# Patient Record
Sex: Female | Born: 1955 | Race: White | Hispanic: No | Marital: Married | Smoking: Former smoker
Health system: Southern US, Community
[De-identification: ages and names within clinical notes are randomized; demographics above are authoritative.]

## PROBLEM LIST (undated history)

## (undated) DIAGNOSIS — Z8739 Personal history of other diseases of the musculoskeletal system and connective tissue: Secondary | ICD-10-CM

## (undated) DIAGNOSIS — IMO0002 Reserved for concepts with insufficient information to code with codable children: Secondary | ICD-10-CM

## (undated) HISTORY — PX: BREAST BIOPSY: SHX20

## (undated) HISTORY — PX: FOOT SURGERY: SHX648

---

## 2001-07-20 ENCOUNTER — Other Ambulatory Visit: Admission: RE | Admit: 2001-07-20 | Discharge: 2001-07-20 | Payer: Self-pay | Admitting: Obstetrics & Gynecology

## 2004-08-18 ENCOUNTER — Other Ambulatory Visit: Admission: RE | Admit: 2004-08-18 | Discharge: 2004-08-18 | Payer: Self-pay | Admitting: Obstetrics and Gynecology

## 2005-10-05 ENCOUNTER — Other Ambulatory Visit: Admission: RE | Admit: 2005-10-05 | Discharge: 2005-10-05 | Payer: Self-pay | Admitting: Obstetrics and Gynecology

## 2006-10-16 ENCOUNTER — Other Ambulatory Visit: Admission: RE | Admit: 2006-10-16 | Discharge: 2006-10-16 | Payer: Self-pay | Admitting: Obstetrics and Gynecology

## 2009-08-07 ENCOUNTER — Ambulatory Visit (HOSPITAL_BASED_OUTPATIENT_CLINIC_OR_DEPARTMENT_OTHER): Admission: RE | Admit: 2009-08-07 | Discharge: 2009-08-07 | Payer: Self-pay | Admitting: Obstetrics and Gynecology

## 2009-08-07 ENCOUNTER — Ambulatory Visit: Payer: Self-pay | Admitting: Diagnostic Radiology

## 2010-09-06 ENCOUNTER — Other Ambulatory Visit (HOSPITAL_BASED_OUTPATIENT_CLINIC_OR_DEPARTMENT_OTHER): Payer: Self-pay | Admitting: Obstetrics and Gynecology

## 2010-09-06 DIAGNOSIS — Z1231 Encounter for screening mammogram for malignant neoplasm of breast: Secondary | ICD-10-CM

## 2010-09-07 ENCOUNTER — Ambulatory Visit (HOSPITAL_BASED_OUTPATIENT_CLINIC_OR_DEPARTMENT_OTHER)
Admission: RE | Admit: 2010-09-07 | Discharge: 2010-09-07 | Disposition: A | Discharge status: Home / Self Care | Payer: PRIVATE HEALTH INSURANCE | Source: Ambulatory Visit | Attending: Obstetrics and Gynecology | Admitting: Obstetrics and Gynecology

## 2010-09-07 DIAGNOSIS — Z1231 Encounter for screening mammogram for malignant neoplasm of breast: Secondary | ICD-10-CM | POA: Insufficient documentation

## 2011-09-16 ENCOUNTER — Emergency Department
Admission: EM | Admit: 2011-09-16 | Discharge: 2011-09-16 | Disposition: A | Discharge status: Home / Self Care | Payer: PRIVATE HEALTH INSURANCE | Source: Home / Self Care | Attending: Emergency Medicine | Admitting: Emergency Medicine

## 2011-09-16 DIAGNOSIS — R300 Dysuria: Secondary | ICD-10-CM

## 2011-09-16 HISTORY — DX: Reserved for concepts with insufficient information to code with codable children: IMO0002

## 2011-09-16 LAB — POCT URINALYSIS DIP (MANUAL ENTRY)
Ketones, POC UA: NEGATIVE
Nitrite, UA: POSITIVE
Protein Ur, POC: NEGATIVE
Spec Grav, UA: 1.015 (ref 1.005–1.03)

## 2011-09-16 MED ORDER — CIPROFLOXACIN HCL 500 MG PO TABS: 500.0000 mg | ORAL_TABLET | Freq: Two times a day (BID) | ORAL | Status: AC

## 2011-09-16 NOTE — ED Notes (Signed)
Patient complains of painful urination x 1 day. Denies back pain.

## 2011-09-16 NOTE — ED Provider Notes (Signed)
History     CSN: [id number]  Arrival date & time 09/16/11  1821   First MD Initiated Contact with Patient 09/16/11 1825      Chief Complaint  Patient presents with  . Dysuria    x 1 day    (Consider location/radiation/quality/duration/timing/severity/associated sxs/prior treatment) HPI Connie Campos is a 56 y.o. female who presents today with UTI symptoms for 1 day.  She describes it as a "discomfort" during urination.  She has not been using any medications yet.  + dysuria No frequency No urgency No hematuria No vaginal discharge No fever/chills No lower abdominal pain No back pain No fatigue    Past Medical History  Diagnosis Date  . Herniated disc     Past Surgical History  Procedure Date  . Foot surgery     Family History  Problem Relation Age of Onset  . Diabetes Mother   . Cancer Father     lung  . Diabetes Sister   . Cancer Sister 75    Breast  . Diabetes Brother     History  Substance Use Topics  . Smoking status: Former Smoker -- 0.1 packs/day for 10 years  . Smokeless tobacco: Never Used  . Alcohol Use: 2.4 oz/week    4 Glasses of wine per week    OB History    Grav Para Term Preterm Abortions TAB SAB Ect Mult Living                  Review of Systems  All other systems reviewed and are negative.    Allergies  Monistat  Home Medications   Current Outpatient Rx  Name Route Sig Dispense Refill  . CYCLOBENZAPRINE HCL 10 MG PO TABS Oral Take 10 mg by mouth 3 (three) times daily as needed.    Marland Kitchen DICLOFENAC SODIUM 75 MG PO TBEC Oral Take 75 mg by mouth 2 (two) times daily as needed.    Marland Kitchen CIPROFLOXACIN HCL 500 MG PO TABS Oral Take 1 tablet (500 mg total) by mouth 2 (two) times daily. 10 tablet 0    BP 145/88  Pulse 76  Temp(Src) 97.7 F (36.5 C) (Oral)  Resp 16  Ht 5\' 6"  (1.676 m)  Wt 171 lb (77.565 kg)  BMI 27.60 kg/m2  SpO2 96%  Physical Exam  Nursing note and vitals reviewed. Constitutional: She is oriented to person, place,  and time. She appears well-developed and well-nourished.  HENT:  Head: Normocephalic and atraumatic.  Eyes: No scleral icterus.  Neck: Neck supple.  Cardiovascular: Regular rhythm and normal heart sounds.   Pulmonary/Chest: Effort normal and breath sounds normal. No respiratory distress.  Abdominal: Soft. Normal appearance and bowel sounds are normal. She exhibits no mass. There is no rebound, no guarding and no CVA tenderness.  Neurological: She is alert and oriented to person, place, and time.  Skin: Skin is warm and dry.  Psychiatric: She has a normal mood and affect. Her speech is normal.    ED Course  Procedures (including critical care time)  Labs Reviewed - No data to display No results found.   1. Dysuria       MDM  1) Take the prescribed antibiotic as directed.  Can take OTC Azo. 2) A urinalysis was done in clinic.  A urine culture is pending. 3) Follow up with your PCP or urologist if not improving or if worsening symptoms.     Marlaine Hind, MD 09/16/11 [phone]

## 2011-09-17 ENCOUNTER — Telehealth: Payer: Self-pay | Admitting: Family Medicine

## 2011-09-19 ENCOUNTER — Other Ambulatory Visit (HOSPITAL_BASED_OUTPATIENT_CLINIC_OR_DEPARTMENT_OTHER): Payer: Self-pay | Admitting: Obstetrics and Gynecology

## 2011-09-19 ENCOUNTER — Telehealth: Payer: Self-pay | Admitting: Family Medicine

## 2011-09-19 DIAGNOSIS — Z1231 Encounter for screening mammogram for malignant neoplasm of breast: Secondary | ICD-10-CM

## 2011-09-19 LAB — URINE CULTURE: Colony Count: >=100000

## 2011-09-29 ENCOUNTER — Ambulatory Visit (HOSPITAL_BASED_OUTPATIENT_CLINIC_OR_DEPARTMENT_OTHER)
Admission: RE | Admit: 2011-09-29 | Discharge: 2011-09-29 | Disposition: A | Discharge status: Home / Self Care | Payer: PRIVATE HEALTH INSURANCE | Source: Ambulatory Visit | Attending: Obstetrics and Gynecology | Admitting: Obstetrics and Gynecology

## 2011-09-29 ENCOUNTER — Ambulatory Visit (HOSPITAL_BASED_OUTPATIENT_CLINIC_OR_DEPARTMENT_OTHER): Payer: PRIVATE HEALTH INSURANCE

## 2011-09-29 DIAGNOSIS — Z1231 Encounter for screening mammogram for malignant neoplasm of breast: Secondary | ICD-10-CM | POA: Insufficient documentation

## 2012-09-10 ENCOUNTER — Other Ambulatory Visit (HOSPITAL_BASED_OUTPATIENT_CLINIC_OR_DEPARTMENT_OTHER): Payer: Self-pay | Admitting: Obstetrics and Gynecology

## 2012-09-10 DIAGNOSIS — Z1231 Encounter for screening mammogram for malignant neoplasm of breast: Secondary | ICD-10-CM

## 2012-09-11 ENCOUNTER — Ambulatory Visit (HOSPITAL_BASED_OUTPATIENT_CLINIC_OR_DEPARTMENT_OTHER)
Admission: RE | Admit: 2012-09-11 | Discharge: 2012-09-11 | Disposition: A | Discharge status: Home / Self Care | Payer: Managed Care, Other (non HMO) | Source: Ambulatory Visit | Attending: Obstetrics and Gynecology | Admitting: Obstetrics and Gynecology

## 2012-09-11 DIAGNOSIS — Z1231 Encounter for screening mammogram for malignant neoplasm of breast: Secondary | ICD-10-CM | POA: Insufficient documentation

## 2012-11-01 ENCOUNTER — Encounter: Payer: Self-pay | Admitting: Emergency Medicine

## 2012-11-01 ENCOUNTER — Emergency Department
Admission: EM | Admit: 2012-11-01 | Discharge: 2012-11-01 | Disposition: A | Discharge status: Home / Self Care | Payer: Managed Care, Other (non HMO) | Source: Home / Self Care | Attending: Family Medicine | Admitting: Family Medicine

## 2012-11-01 DIAGNOSIS — B373 Candidiasis of vulva and vagina: Secondary | ICD-10-CM

## 2012-11-01 DIAGNOSIS — R300 Dysuria: Secondary | ICD-10-CM

## 2012-11-01 LAB — POCT URINALYSIS DIP (MANUAL ENTRY)
Glucose, UA: NEGATIVE
Spec Grav, UA: 1.02 (ref 1.005–1.03)
Urobilinogen, UA: 0.2 (ref 0–1)

## 2012-11-01 MED ORDER — FLUCONAZOLE 150 MG PO TABS: 150.0000 mg | ORAL_TABLET | Freq: Once | ORAL | Status: DC

## 2012-11-01 MED ORDER — CEPHALEXIN 500 MG PO CAPS: 500.0000 mg | ORAL_CAPSULE | Freq: Three times a day (TID) | ORAL | Status: DC

## 2012-11-01 NOTE — ED Provider Notes (Signed)
History     CSN: [id number]  Arrival date & time 11/01/12  1745   First MD Initiated Contact with Patient 11/01/12 1747      Chief Complaint  Patient presents with  . Urinary Frequency   HPI  DYSURIA Onset:  2-3 days  Description: mild dysuria, increased urinary frequency  Modifying factors: none   Symptoms Urgency:  yes Frequency: yes  Hesitancy:  yes Hematuria:  no Flank Pain:  no Fever: no Nausea/Vomiting:  no Missed LMP: no STD exposure: no Discharge: yes; concerning for yeast infection  Irritants: no Rash: no  Red Flags   More than 3 UTI's last 12 months:  no PMH of  Diabetes or Immunosuppression:  no Renal Disease/Calculi: no Urinary Tract Abnormality:  no Instrumentation or Trauma: no    Past Medical History  Diagnosis Date  . Herniated disc     Past Surgical History  Procedure Laterality Date  . Foot surgery      Family History  Problem Relation Age of Onset  . Diabetes Mother   . Cancer Father     lung  . Diabetes Sister   . Cancer Sister 102    Breast  . Diabetes Brother     History  Substance Use Topics  . Smoking status: Former Smoker -- 0.10 packs/day for 10 years  . Smokeless tobacco: Never Used  . Alcohol Use: 2.4 oz/week    4 Glasses of wine per week    OB History   Grav Para Term Preterm Abortions TAB SAB Ect Mult Living                  Review of Systems  All other systems reviewed and are negative.    Allergies  Miconazole nitrate  Home Medications   Current Outpatient Rx  Name  Route  Sig  Dispense  Refill  . cyclobenzaprine (FLEXERIL) 10 MG tablet   Oral   Take 10 mg by mouth 3 (three) times daily as needed.         . diclofenac (VOLTAREN) 75 MG EC tablet   Oral   Take 75 mg by mouth 2 (two) times daily as needed.           BP 125/84  Pulse 62  Temp(Src) 97.2 F (36.2 C) (Oral)  Resp 16  Ht 5' 6.5" (1.689 m)  Wt 166 lb (75.297 kg)  BMI 26.39 kg/m2  SpO2 97%  Physical Exam   Constitutional: She appears well-developed and well-nourished.  HENT:  Head: Normocephalic and atraumatic.  Eyes: Conjunctivae are normal. Pupils are equal, round, and reactive to light.  Neck: Normal range of motion. Neck supple.  Cardiovascular: Normal rate and regular rhythm.   Pulmonary/Chest: Effort normal and breath sounds normal.  Abdominal: Soft. Bowel sounds are normal.  No flank pain  Minimal suprapubic tenderness   Musculoskeletal: Normal range of motion.  Neurological: She is alert.  Skin: Skin is warm.    ED Course  Procedures (including critical care time)  Labs Reviewed  URINE CULTURE  POCT URINALYSIS DIP (MANUAL ENTRY)   No results found.   1. Dysuria   2. Vaginal candidiasis       MDM  Will treat with keflex Urine culture Diflucan Discussed general, infectious, and GU red flags Follow up as needed.     The patient and/or caregiver has been counseled thoroughly with regard to treatment plan and/or medications prescribed including dosage, schedule, interactions, rationale for use, and possible side effects and  they verbalize understanding. Diagnoses and expected course of recovery discussed and will return if not improved as expected or if the condition worsens. Patient and/or caregiver verbalized understanding.             Doree Albee, MD 11/01/12 1827

## 2012-11-01 NOTE — ED Notes (Signed)
Patient gives 2 day history of frequency of urination with small bloody discharge. No OTCs

## 2012-11-03 LAB — URINE CULTURE: Colony Count: >=100000

## 2012-11-04 ENCOUNTER — Telehealth: Payer: Self-pay

## 2012-11-04 MED ORDER — CIPROFLOXACIN HCL 500 MG PO TABS: 500.0000 mg | ORAL_TABLET | Freq: Two times a day (BID) | ORAL | Status: AC

## 2012-11-04 NOTE — ED Notes (Addendum)
I called and spoke with patient and she is doing better. I advised to call back if anything changes or if she has questions or concerns. Advised her to finish antibiotic, Per Dr Alvester Morin she needs different antibiotic.

## 2013-02-11 ENCOUNTER — Encounter: Payer: Self-pay | Admitting: *Deleted

## 2013-02-11 ENCOUNTER — Emergency Department
Admission: EM | Admit: 2013-02-11 | Discharge: 2013-02-11 | Disposition: A | Discharge status: Home / Self Care | Payer: Managed Care, Other (non HMO) | Source: Home / Self Care | Attending: Family Medicine | Admitting: Family Medicine

## 2013-02-11 DIAGNOSIS — J029 Acute pharyngitis, unspecified: Secondary | ICD-10-CM

## 2013-02-11 LAB — POCT RAPID STREP A (OFFICE): Rapid Strep A Screen: NEGATIVE

## 2013-02-11 MED ORDER — FLUCONAZOLE 150 MG PO TABS: 150.0000 mg | ORAL_TABLET | Freq: Once | ORAL | Status: DC

## 2013-02-11 MED ORDER — DICLOFENAC SODIUM 75 MG PO TBEC: 75.0000 mg | DELAYED_RELEASE_TABLET | Freq: Two times a day (BID) | ORAL | Status: DC | PRN

## 2013-02-11 MED ORDER — PENICILLIN V POTASSIUM 500 MG PO TABS: ORAL_TABLET | ORAL | Status: DC

## 2013-02-11 NOTE — ED Notes (Signed)
Pt c/o HA, sore throat, and bilateral ear ache x 3 days. Denies fever.

## 2013-02-11 NOTE — ED Provider Notes (Signed)
CSN: [id number]     Arrival date & time 02/11/13  1145 History   First MD Initiated Contact with Patient 02/11/13 1205     Chief Complaint  Patient presents with  . Sore Throat  . Otalgia  . Headache      HPI Comments: Patient complains of onset of sore throat and mild ear congestion about 3 days ago.  No other URI symptoms.  The history is provided by the patient.    Past Medical History  Diagnosis Date  . Herniated disc    Past Surgical History  Procedure Laterality Date  . Foot surgery     Family History  Problem Relation Age of Onset  . Diabetes Mother   . Cancer Father     lung  . Diabetes Sister   . Cancer Sister 44    Breast  . Diabetes Brother    History  Substance Use Topics  . Smoking status: Former Smoker -- 0.10 packs/day for 10 years  . Smokeless tobacco: Never Used  . Alcohol Use: 2.4 oz/week    4 Glasses of wine per week   OB History   Grav Para Term Preterm Abortions TAB SAB Ect Mult Living                 Review of Systems + sore throat No cough No pleuritic pain No wheezing No nasal congestion No post-nasal drainage No sinus pain/pressure No itchy/red eyes ? earache No hemoptysis No SOB No fever/chills No nausea No vomiting No abdominal pain No diarrhea No urinary symptoms No skin rashes No fatigue No myalgias No headache    Allergies  Miconazole nitrate  Home Medications   Current Outpatient Rx  Name  Route  Sig  Dispense  Refill  . phentermine 37.5 MG capsule   Oral   Take 37.5 mg by mouth every morning.         . cyclobenzaprine (FLEXERIL) 10 MG tablet   Oral   Take 10 mg by mouth 3 (three) times daily as needed.         . diclofenac (VOLTAREN) 75 MG EC tablet   Oral   Take 75 mg by mouth 2 (two) times daily as needed.         . fluconazole (DIFLUCAN) 150 MG tablet   Oral   Take 1 tablet (150 mg total) by mouth once. Repeat if needed   2 tablet   0   . penicillin v potassium (VEETID) 500 MG tablet     Take one tab by mouth twice daily for 10 days   20 tablet   0    BP 111/76  Pulse 81  Temp(Src) 98.3 F (36.8 C) (Oral)  Resp 16  Wt 158 lb (71.668 kg)  BMI 25.12 kg/m2  SpO2 97% Physical Exam Nursing notes and Vital Signs reviewed. Appearance:  Patient appears healthy, stated age, and in no acute distress Eyes:  Pupils are equal, round, and reactive to light and accomodation.  Extraocular movement is intact.  Conjunctivae are not inflamed  Ears:  Canals normal.  Tympanic membranes normal.  Nose:   Normal turbinates.  No sinus tenderness.    Pharynx:  Erythematous without exudate. Neck:  Supple.  Tender shotty anterior nodes. Lungs:  Clear to auscultation.  Breath sounds are equal.  Heart:  Regular rate and rhythm without murmurs, rubs, or gallops.  Abdomen:  Nontender without masses or hepatosplenomegaly.  Bowel sounds are present.  No CVA or flank tenderness.  Extremities:  No edema.  No calf tenderness Skin:  No rash present.   ED Course  Procedures  none    Labs Reviewed  STREP A DNA PROBE pending  POCT RAPID STREP A (OFFICE) negative       MDM   1. Acute pharyngitis; ?false negative rapid strep test    Throat culture pending. Begin Pen VK.  At patient's request, Rx written for Diflucan (2 tabs) (reports no adverse effects from Diflucan. May continue Voltaren (Rx written).  Try warm salt water gargles. Followup with Family Doctor if not improved in one week.     Lattie Haw, MD 02/11/13 1258

## 2013-02-13 ENCOUNTER — Telehealth: Payer: Self-pay | Admitting: *Deleted

## 2013-04-22 ENCOUNTER — Other Ambulatory Visit: Payer: Self-pay | Admitting: Family Medicine

## 2013-04-22 DIAGNOSIS — Z780 Asymptomatic menopausal state: Secondary | ICD-10-CM

## 2013-04-23 ENCOUNTER — Other Ambulatory Visit: Payer: Self-pay | Admitting: Family Medicine

## 2013-04-23 DIAGNOSIS — Z780 Asymptomatic menopausal state: Secondary | ICD-10-CM

## 2013-04-23 DIAGNOSIS — Z0000 Encounter for general adult medical examination without abnormal findings: Secondary | ICD-10-CM

## 2013-05-07 ENCOUNTER — Ambulatory Visit (INDEPENDENT_AMBULATORY_CARE_PROVIDER_SITE_OTHER): Payer: Managed Care, Other (non HMO)

## 2013-05-07 ENCOUNTER — Other Ambulatory Visit: Payer: Managed Care, Other (non HMO)

## 2013-05-07 DIAGNOSIS — M899 Disorder of bone, unspecified: Secondary | ICD-10-CM

## 2013-05-07 DIAGNOSIS — Z0000 Encounter for general adult medical examination without abnormal findings: Secondary | ICD-10-CM

## 2013-09-19 ENCOUNTER — Other Ambulatory Visit (HOSPITAL_BASED_OUTPATIENT_CLINIC_OR_DEPARTMENT_OTHER): Payer: Self-pay | Admitting: Obstetrics and Gynecology

## 2013-09-19 DIAGNOSIS — Z1231 Encounter for screening mammogram for malignant neoplasm of breast: Secondary | ICD-10-CM

## 2013-09-20 ENCOUNTER — Ambulatory Visit (HOSPITAL_BASED_OUTPATIENT_CLINIC_OR_DEPARTMENT_OTHER)
Admission: RE | Admit: 2013-09-20 | Discharge: 2013-09-20 | Disposition: A | Discharge status: Home / Self Care | Payer: Managed Care, Other (non HMO) | Source: Ambulatory Visit | Attending: Obstetrics and Gynecology | Admitting: Obstetrics and Gynecology

## 2013-09-20 DIAGNOSIS — Z1231 Encounter for screening mammogram for malignant neoplasm of breast: Secondary | ICD-10-CM

## 2014-07-19 ENCOUNTER — Emergency Department
Admission: EM | Admit: 2014-07-19 | Discharge: 2014-07-19 | Disposition: A | Discharge status: Home / Self Care | Payer: Managed Care, Other (non HMO) | Source: Home / Self Care | Attending: Family Medicine | Admitting: Family Medicine

## 2014-07-19 DIAGNOSIS — T700XXA Otitic barotrauma, initial encounter: Secondary | ICD-10-CM

## 2014-07-19 DIAGNOSIS — J029 Acute pharyngitis, unspecified: Secondary | ICD-10-CM

## 2014-07-19 LAB — POCT RAPID STREP A (OFFICE): RAPID STREP A SCREEN: NEGATIVE

## 2014-07-19 MED ORDER — PREDNISONE 10 MG PO TABS: 30.0000 mg | ORAL_TABLET | Freq: Every day | ORAL | Status: DC

## 2014-07-19 MED ORDER — IPRATROPIUM BROMIDE 0.06 % NA SOLN: 2.0000 | Freq: Four times a day (QID) | NASAL | Status: DC

## 2014-07-19 NOTE — ED Provider Notes (Signed)
Connie FlockGeraldine Campos is a 59 y.o. female who presents to Urgent Care today for headaches sore throat and facial pain and ear congestion starting yesterday. Patient's been exposed to sick people at work. No fevers or chills vomiting or diarrhea. Patient has not tried any medications yet. No trouble breathing. No vomiting or diarrhea.   Past Medical History  Diagnosis Date  . Herniated disc    Past Surgical History  Procedure Laterality Date  . Foot surgery     History  Substance Use Topics  . Smoking status: Former Smoker -- 0.10 packs/day for 10 years  . Smokeless tobacco: Never Used  . Alcohol Use: 2.4 oz/week    4 Glasses of wine per week   ROS as above Medications: No current facility-administered medications for this encounter.   Current Outpatient Prescriptions  Medication Sig Dispense Refill  . ipratropium (ATROVENT) 0.06 % nasal spray Place 2 sprays into both nostrils 4 (four) times daily. 15 mL 1  . predniSONE (DELTASONE) 10 MG tablet Take 3 tablets (30 mg total) by mouth daily. 15 tablet 0   Allergies  Allergen Reactions  . Miconazole Nitrate      Exam:  BP 122/86 mmHg  Pulse 80  Temp(Src) 97.9 F (36.6 C) (Oral)  Wt 174 lb (78.926 kg)  SpO2 95% Gen: Well NAD HEENT: EOMI,  MMM tympanic membranes are retracted bilaterally without erythema. Mastoids are nontender. Posterior pharynx with cobblestoning. Clear nasal discharge. Nontender maxillary and frontal sinuses. Lungs: Normal work of breathing. CTABL Heart: RRR no MRG Abd: NABS, Soft. Nondistended, Nontender Exts: Brisk capillary refill, warm and well perfused.   Results for orders placed or performed during the hospital encounter of 07/19/14 (from the past 24 hour(s))  POCT rapid strep A     Status: None   Collection Time: 07/19/14 12:11 PM  Result Value Ref Range   Rapid Strep A Screen Negative Negative   No results found.  Assessment and Plan: 59 y.o. female w viral pharyngitis. Treatment with Atrovent  nasal spray and prednisone. Culture pending. Follow-up as needed.  Discussed warning signs or symptoms. Please see discharge instructions. Patient expresses understanding.     Rodolph BongEvan S Corey, MD 07/19/14 [phone]

## 2014-07-19 NOTE — ED Notes (Signed)
Patient c/o sore throat, ears feel "clogged" sx started last night

## 2014-07-19 NOTE — Discharge Instructions (Signed)
Thank you for coming in today. °Call or go to the emergency room if you get worse, have trouble breathing, have chest pains, or palpitations.  ° °Pharyngitis °Pharyngitis is redness, pain, and swelling (inflammation) of your pharynx.  °CAUSES  °Pharyngitis is usually caused by infection. Most of the time, these infections are from viruses (viral) and are part of a cold. However, sometimes pharyngitis is caused by bacteria (bacterial). Pharyngitis can also be caused by allergies. Viral pharyngitis may be spread from person to person by coughing, sneezing, and personal items or utensils (cups, forks, spoons, toothbrushes). Bacterial pharyngitis may be spread from person to person by more intimate contact, such as kissing.  °SIGNS AND SYMPTOMS  °Symptoms of pharyngitis include:   °· Sore throat.   °· Tiredness (fatigue).   °· Low-grade fever.   °· Headache. °· Joint pain and muscle aches. °· Skin rashes. °· Swollen lymph nodes. °· Plaque-like film on throat or tonsils (often seen with bacterial pharyngitis). °DIAGNOSIS  °Your health care provider will ask you questions about your illness and your symptoms. Your medical history, along with a physical exam, is often all that is needed to diagnose pharyngitis. Sometimes, a rapid strep test is done. Other lab tests may also be done, depending on the suspected cause.  °TREATMENT  °Viral pharyngitis will usually get better in 3-4 days without the use of medicine. Bacterial pharyngitis is treated with medicines that kill germs (antibiotics).  °HOME CARE INSTRUCTIONS  °· Drink enough water and fluids to keep your urine clear or pale yellow.   °· Only take over-the-counter or prescription medicines as directed by your health care provider:   °· If you are prescribed antibiotics, make sure you finish them even if you start to feel better.   °· Do not take aspirin.   °· Get lots of rest.   °· Gargle with 8 oz of salt water (½ tsp of salt per 1 qt of water) as often as every 1-2  hours to soothe your throat.   °· Throat lozenges (if you are not at risk for choking) or sprays may be used to soothe your throat. °SEEK MEDICAL CARE IF:  °· You have large, tender lumps in your neck. °· You have a rash. °· You cough up green, yellow-brown, or bloody spit. °SEEK IMMEDIATE MEDICAL CARE IF:  °· Your neck becomes stiff. °· You drool or are unable to swallow liquids. °· You vomit or are unable to keep medicines or liquids down. °· You have severe pain that does not go away with the use of recommended medicines. °· You have trouble breathing (not caused by a stuffy nose). °MAKE SURE YOU:  °· Understand these instructions. °· Will watch your condition. °· Will get help right away if you are not doing well or get worse. °Document Released: 05/30/2005 Document Revised: 03/20/2013 Document Reviewed: 02/04/2013 °[employer]® Patient Information ©2015 [employer]. This information is not intended to replace advice given to you by your health care provider. Make sure you discuss any questions you have with your health care provider. ° ° °Barotitis Media °Barotitis media is inflammation of your middle ear. This occurs when the auditory tube (eustachian tube) leading from the back of your nose (nasopharynx) to your eardrum is blocked. This blockage may result from a cold, environmental allergies, or an upper respiratory infection. Unresolved barotitis media may lead to damage or hearing loss (barotrauma), which may become permanent. °HOME CARE INSTRUCTIONS  °· Use medicines as recommended by your health care provider. Over-the-counter medicines will help unblock the canal   and can help during times of air travel. °· Do not put anything into your ears to clean or unplug them. Eardrops will not be helpful. °· Do not swim, dive, or fly until your health care provider says it is all right to do so. If these activities are necessary, chewing gum with frequent, forceful swallowing may help. It is also helpful to hold  your nose and gently blow to pop your ears for equalizing pressure changes. This forces air into the eustachian tube. °· Only take over-the-counter or prescription medicines for pain, discomfort, or fever as directed by your health care provider. °· A decongestant may be helpful in decongesting the middle ear and make pressure equalization easier. °SEEK MEDICAL CARE IF: °· You experience a serious form of dizziness in which you feel as if the room is spinning and you feel nauseated (vertigo). °· Your symptoms only involve one ear. °SEEK IMMEDIATE MEDICAL CARE IF:  °· You develop a severe headache, dizziness, or severe ear pain. °· You have bloody or pus-like drainage from your ears. °· You develop a fever. °· Your problems do not improve or become worse. °MAKE SURE YOU:  °· Understand these instructions. °· Will watch your condition. °· Will get help right away if you are not doing well or get worse. °Document Released: 05/27/2000 Document Revised: 03/20/2013 Document Reviewed: 12/25/2012 °[employer]® Patient Information ©2015 [employer]. This information is not intended to replace advice given to you by your health care provider. Make sure you discuss any questions you have with your health care provider. ° °

## 2014-07-21 ENCOUNTER — Telehealth: Payer: Self-pay | Admitting: *Deleted

## 2014-08-28 ENCOUNTER — Other Ambulatory Visit (HOSPITAL_BASED_OUTPATIENT_CLINIC_OR_DEPARTMENT_OTHER): Payer: Self-pay | Admitting: Nurse Practitioner

## 2014-08-28 DIAGNOSIS — Z1231 Encounter for screening mammogram for malignant neoplasm of breast: Secondary | ICD-10-CM

## 2014-09-01 ENCOUNTER — Ambulatory Visit (HOSPITAL_BASED_OUTPATIENT_CLINIC_OR_DEPARTMENT_OTHER): Payer: Managed Care, Other (non HMO)

## 2014-09-02 ENCOUNTER — Ambulatory Visit (HOSPITAL_BASED_OUTPATIENT_CLINIC_OR_DEPARTMENT_OTHER)
Admission: RE | Admit: 2014-09-02 | Discharge: 2014-09-02 | Disposition: A | Discharge status: Home / Self Care | Payer: Managed Care, Other (non HMO) | Source: Ambulatory Visit | Attending: Nurse Practitioner | Admitting: Nurse Practitioner

## 2014-09-02 DIAGNOSIS — Z1231 Encounter for screening mammogram for malignant neoplasm of breast: Secondary | ICD-10-CM | POA: Diagnosis present

## 2015-04-12 ENCOUNTER — Emergency Department
Admission: EM | Admit: 2015-04-12 | Discharge: 2015-04-12 | Disposition: A | Discharge status: Home / Self Care | Payer: Managed Care, Other (non HMO) | Source: Home / Self Care | Attending: Family Medicine | Admitting: Family Medicine

## 2015-04-12 ENCOUNTER — Encounter: Payer: Self-pay | Admitting: Emergency Medicine

## 2015-04-12 DIAGNOSIS — J069 Acute upper respiratory infection, unspecified: Secondary | ICD-10-CM | POA: Diagnosis not present

## 2015-04-12 DIAGNOSIS — B9789 Other viral agents as the cause of diseases classified elsewhere: Principal | ICD-10-CM

## 2015-04-12 MED ORDER — AMOXICILLIN 875 MG PO TABS: 875.0000 mg | ORAL_TABLET | Freq: Two times a day (BID) | ORAL | Status: DC

## 2015-04-12 NOTE — ED Notes (Signed)
Reports one week of congestion, initial sore throat, cough, and ear pain. Took OTC this morning.

## 2015-04-12 NOTE — ED Provider Notes (Signed)
CSN: [id number]     Arrival date & time 04/12/15  1144 History   First MD Initiated Contact with Patient 04/12/15 1244     Chief Complaint  Patient presents with  . Nasal Congestion  . Ear Pain  . Cough      HPI Comments: Patient complains of six day history of typical cold-like symptoms including mild sore throat (now resolved), sinus congestion, fatigue, ear pressure, and cough.   The history is provided by the patient.    Past Medical History  Diagnosis Date  . Herniated disc    Past Surgical History  Procedure Laterality Date  . Foot surgery     Family History  Problem Relation Age of Onset  . Diabetes Mother   . Cancer Father     lung  . Diabetes Sister   . Cancer Sister 760    Breast  . Diabetes Brother    Social History  Substance Use Topics  . Smoking status: Former Smoker -- 0.10 packs/day for 10 years  . Smokeless tobacco: Never Used  . Alcohol Use: 2.4 oz/week    4 Glasses of wine per week   OB History    No data available     Review of Systems + sore throat + cough + sneezing No pleuritic pain No wheezing + nasal congestion + post-nasal drainage No sinus pain/pressure No itchy/red eyes ? earache No hemoptysis No SOB ? fever, no chills No nausea No vomiting No abdominal pain No diarrhea No urinary symptoms No skin rash + fatigue No myalgias No headache Used OTC meds without relief  Allergies  Miconazole nitrate  Home Medications   Prior to Admission medications   Medication Sig Start Date End Date Taking? Authorizing Provider  diclofenac sodium (VOLTAREN) 1 % GEL Apply topically 4 (four) times daily.   Yes Historical Provider, MD  amoxicillin (AMOXIL) 875 MG tablet Take 1 tablet (875 mg total) by mouth 2 (two) times daily. (Rx void after 04/19/15) 04/12/15   Lattie HawStephen A Asharia Lotter, MD  ipratropium (ATROVENT) 0.06 % nasal spray Place 2 sprays into both nostrils 4 (four) times daily. 07/19/14   Rodolph BongEvan S Corey, MD   Meds Ordered and  Administered this Visit  Medications - No data to display  BP 116/78 mmHg  Pulse 77  Temp(Src) 97.9 F (36.6 C) (Oral)  Resp 16  Ht 5\' 6"  (1.676 m)  Wt 165 lb (74.844 kg)  BMI 26.64 kg/m2  SpO2 96% No data found.   Physical Exam Nursing notes and Vital Signs reviewed. Appearance:  Patient appears stated age, and in no acute distress Eyes:  Pupils are equal, round, and reactive to light and accomodation.  Extraocular movement is intact.  Conjunctivae are not inflamed  Ears:  Canals normal.  Tympanic membranes normal.  Nose:  Congested turbinates.  No sinus tenderness.    Pharynx:  Normal Neck:  Supple.  Tender enlarged posterior nodes are palpated bilaterally  Lungs:  Clear to auscultation.  Breath sounds are equal.  Moving air well. Heart:  Regular rate and rhythm without murmurs, rubs, or gallops.  Abdomen:  Nontender without masses or hepatosplenomegaly.  Bowel sounds are present.  No CVA or flank tenderness.  Extremities:  No edema.  No calf tenderness Skin:  No rash present.   ED Course  Procedures  none  MDM   1. Viral URI with cough    There is no evidence of bacterial infection today.  Treat symptomatically for now  Take plain guaifenesin (   extended release tabs such as Mucinex) twice daily, with plenty of water, for cough and congestion.  May add Pseudoephedrine ( , one or two every 4 to 6 hours) for sinus congestion.  Get adequate rest.   May use Afrin nasal spray (or generic oxymetazoline) twice daily for about 5 days and then discontinue.  Also recommend using saline nasal spray several times daily and saline nasal irrigation (AYR is a common brand)  Try warm salt water gargles for sore throat.  Stop all antihistamines for now, and other non-prescription cough/cold preparations. May take Delsym Cough Suppressant at bedtime for nighttime cough.  Begin Amoxicillin if not improving about 5 to 7 days or if persistent fever develops (Given a prescription to  hold, with an expiration date)  Follow-up with family doctor if not improving about10 days.     Lattie Haw, MD 04/16/15 [phone]

## 2015-04-12 NOTE — Discharge Instructions (Signed)
Take plain guaifenesin (1200mg  extended release tabs such as Mucinex) twice daily, with plenty of water, for cough and congestion.  May add Pseudoephedrine (30mg , one or two every 4 to 6 hours) for sinus congestion.  Get adequate rest.   May use Afrin nasal spray (or generic oxymetazoline) twice daily for about 5 days and then discontinue.  Also recommend using saline nasal spray several times daily and saline nasal irrigation (AYR is a common brand)  Try warm salt water gargles for sore throat.  Stop all antihistamines for now, and other non-prescription cough/cold preparations. May take Delsym Cough Suppressant at bedtime for nighttime cough.  Begin Amoxicillin if not improving about 5 to 7 days or if persistent fever develops  Follow-up with family doctor if not improving about10 days.

## 2015-10-08 ENCOUNTER — Other Ambulatory Visit (HOSPITAL_BASED_OUTPATIENT_CLINIC_OR_DEPARTMENT_OTHER): Payer: Self-pay | Admitting: Obstetrics and Gynecology

## 2015-10-08 DIAGNOSIS — Z1231 Encounter for screening mammogram for malignant neoplasm of breast: Secondary | ICD-10-CM

## 2015-10-12 ENCOUNTER — Ambulatory Visit (HOSPITAL_BASED_OUTPATIENT_CLINIC_OR_DEPARTMENT_OTHER)
Admission: RE | Admit: 2015-10-12 | Discharge: 2015-10-12 | Disposition: A | Discharge status: Home / Self Care | Payer: Managed Care, Other (non HMO) | Source: Ambulatory Visit | Attending: Obstetrics and Gynecology | Admitting: Obstetrics and Gynecology

## 2015-10-12 DIAGNOSIS — Z1231 Encounter for screening mammogram for malignant neoplasm of breast: Secondary | ICD-10-CM | POA: Diagnosis not present

## 2016-09-05 ENCOUNTER — Encounter: Payer: Self-pay | Admitting: *Deleted

## 2016-09-05 ENCOUNTER — Emergency Department
Admission: EM | Admit: 2016-09-05 | Discharge: 2016-09-05 | Disposition: A | Discharge status: Home / Self Care | Payer: Managed Care, Other (non HMO) | Source: Home / Self Care | Attending: Family Medicine | Admitting: Family Medicine

## 2016-09-05 DIAGNOSIS — J069 Acute upper respiratory infection, unspecified: Secondary | ICD-10-CM

## 2016-09-05 DIAGNOSIS — B9789 Other viral agents as the cause of diseases classified elsewhere: Secondary | ICD-10-CM | POA: Diagnosis not present

## 2016-09-05 HISTORY — DX: Personal history of other diseases of the musculoskeletal system and connective tissue: Z87.39

## 2016-09-05 LAB — POCT RAPID STREP A (OFFICE): RAPID STREP A SCREEN: NEGATIVE

## 2016-09-05 MED ORDER — PREDNISONE 20 MG PO TABS: ORAL_TABLET | ORAL | 0 refills | Status: DC

## 2016-09-05 MED ORDER — GUAIFENESIN-CODEINE 100-10 MG/5ML PO SOLN: ORAL | 0 refills | Status: DC

## 2016-09-05 MED ORDER — AMOXICILLIN 875 MG PO TABS: 875.0000 mg | ORAL_TABLET | Freq: Two times a day (BID) | ORAL | 0 refills | Status: DC

## 2016-09-05 NOTE — ED Triage Notes (Signed)
Patient c/o sore throat x 2 days with HA and painful swallowing.

## 2016-09-05 NOTE — Discharge Instructions (Signed)
Take plain guaifenesin (1200mg extended release tabs such as Mucinex) twice daily, with plenty of water, for cough and congestion.  May add Pseudoephedrine (30mg, one or two every 4 to 6 hours) for sinus congestion.  Get adequate rest.   °May use Afrin nasal spray (or generic oxymetazoline) each morning for about 5 days and then discontinue.  Also recommend using saline nasal spray several times daily and saline nasal irrigation (AYR is a common brand).  Use Flonase nasal spray each morning after using Afrin nasal spray and saline nasal irrigation. °Try warm salt water gargles for sore throat.  °Stop all antihistamines for now, and other non-prescription cough/cold preparations. °Begin Amoxicillin if not improving about one week or if persistent fever develops   °Follow-up with family doctor if not improving about10 days.  °

## 2016-09-05 NOTE — ED Provider Notes (Signed)
[facility]    CSN: [id number] Arrival date & time: 09/05/16  0901     History   Chief Complaint Chief Complaint  Patient presents with  . Sore Throat    HPI Camia Dipinto is a 61 y.o. female.   Patient complains of three day history of typical cold-like symptoms developing over several days, including mild sore throat, sinus congestion, headache, chills, and fatigue.  She is now developing an occasional cough.  She has a history of sinusitis.   The history is provided by the patient.    Past Medical History:  Diagnosis Date  . Herniated disc   . History of herniated intervertebral disc     There are no active problems to display for this patient.   Past Surgical History:  Procedure Laterality Date  . FOOT SURGERY      OB History    No data available       Home Medications    Prior to Admission medications   Medication Sig Start Date End Date Taking? Authorizing Provider  naproxen sodium (ANAPROX) 220 MG tablet Take 220 mg by mouth as needed.   Yes Historical Provider, MD  amoxicillin (AMOXIL) 875 MG tablet Take 1 tablet (875 mg total) by mouth 2 (two) times daily. (Rx void after 09/13/16) 09/05/16   Lattie Haw, MD  guaiFENesin-codeine 100-10 MG/5ML syrup Take 10mL by mouth at bedtime as needed for cough 09/05/16   Lattie Haw, MD  predniSONE (DELTASONE) 20 MG tablet Take one tab by mouth twice daily for 5 days, then one daily for 3 days. Take with food. 09/05/16   Lattie Haw, MD    Family History Family History  Problem Relation Age of Onset  . Diabetes Mother   . Cancer Father     lung  . Diabetes Sister   . Cancer Sister 41    Breast  . Diabetes Brother     Social History Social History  Substance Use Topics  . Smoking status: Former Smoker    Packs/day: 0.10    Years: 10.00  . Smokeless tobacco: Never Used  . Alcohol use 2.4 oz/week    4 Glasses of wine per week     Allergies   Miconazole nitrate   Review  of Systems Review of Systems + sore throat + cough No pleuritic pain No wheezing + nasal congestion + post-nasal drainage No sinus pain/pressure No itchy/red eyes No earache No hemoptysis No SOB No fever, + chills No nausea No vomiting No abdominal pain No diarrhea No urinary symptoms No skin rash + fatigue No myalgias + headache Used OTC meds without relief   Physical Exam Triage Vital Signs ED Triage Vitals  Enc Vitals Group     BP 09/05/16 0939 135/90     Pulse Rate 09/05/16 0939 77     Resp --      Temp 09/05/16 0939 97.6 F (36.4 C)     Temp Source 09/05/16 0939 Oral     SpO2 09/05/16 0939 95 %     Weight 09/05/16 0939 177 lb (80.3 kg)     Height --      Head Circumference --      Peak Flow --      Pain Score 09/05/16 0942 5     Pain Loc --      Pain Edu? --      Excl. in GC? --    No data found.   Updated Vital Signs  BP 135/90 (BP Location: Left Arm)   Pulse 77   Temp 97.6 F (36.4 C) (Oral)   Wt 177 lb (80.3 kg)   SpO2 95%   BMI 28.57 kg/m   Visual Acuity Right Eye Distance:   Left Eye Distance:   Bilateral Distance:    Right Eye Near:   Left Eye Near:    Bilateral Near:     Physical Exam Nursing notes and Vital Signs reviewed. Appearance:  Patient appears stated age, and in no acute distress Eyes:  Pupils are equal, round, and reactive to light and accomodation.  Extraocular movement is intact.  Conjunctivae are not inflamed  Ears:  Canals normal.  Tympanic membranes normal.  Nose:  Mildly congested turbinates.  No sinus tenderness.   Pharynx:  Uvula slightly edematous Neck:  Supple.  Tender enlarged posterior/lateral nodes are palpated bilaterally  Lungs:  Clear to auscultation.  Breath sounds are equal.  Moving air well. Heart:  Regular rate and rhythm without murmurs, rubs, or gallops.  Abdomen:  Nontender without masses or hepatosplenomegaly.  Bowel sounds are present.  No CVA or flank tenderness.  Extremities:  No edema.    Skin:  No rash present.    UC Treatments / Results  Labs (all labs ordered are listed, but only abnormal results are displayed) Labs Reviewed  POCT RAPID STREP A (OFFICE) negative    EKG  EKG Interpretation None       Radiology No results found.  Procedures Procedures (including critical care time)  Medications Ordered in UC Medications - No data to display   Initial Impression / Assessment and Plan / UC Course  I have reviewed the triage vital signs and the nursing notes.  Pertinent labs & imaging results that were available during my care of the patient were reviewed by me and considered in my medical decision making (see chart for details).    Suspect early viral URI There is no evidence of bacterial infection today.   Begin prednisone burst/taper.  Rx for Robitussin AC for night time cough.  Take plain guaifenesin (1200mg  extended release tabs such as Mucinex) twice daily, with plenty of water, for cough and congestion.  May add Pseudoephedrine (30mg , one or two every 4 to 6 hours) for sinus congestion.  Get adequate rest.   May use Afrin nasal spray (or generic oxymetazoline) each morning for about 5 days and then discontinue.  Also recommend using saline nasal spray several times daily and saline nasal irrigation (AYR is a common brand).  Use Flonase nasal spray each morning after using Afrin nasal spray and saline nasal irrigation. Try warm salt water gargles for sore throat.  Stop all antihistamines for now, and other non-prescription cough/cold preparations. Begin Amoxicillin if not improving about one week or if persistent fever develops (Given a prescription to hold, with an expiration date)  Follow-up with family doctor if not improving about 10 days.    Final Clinical Impressions(s) / UC Diagnoses   Final diagnoses:  Viral URI    New Prescriptions New Prescriptions   AMOXICILLIN (AMOXIL) 875 MG TABLET    Take 1 tablet (875 mg total) by mouth 2 (two)  times daily. (Rx void after 09/13/16)   GUAIFENESIN-CODEINE 100-10 MG/5ML SYRUP    Take 10mL by mouth at bedtime as needed for cough   PREDNISONE (DELTASONE) 20 MG TABLET    Take one tab by mouth twice daily for 5 days, then one daily for 3 days. Take with food.  Lattie HawStephen A Chris Cripps, MD 09/18/16 2106

## 2016-09-13 ENCOUNTER — Other Ambulatory Visit (HOSPITAL_BASED_OUTPATIENT_CLINIC_OR_DEPARTMENT_OTHER): Payer: Self-pay | Admitting: Obstetrics and Gynecology

## 2016-09-13 DIAGNOSIS — Z1231 Encounter for screening mammogram for malignant neoplasm of breast: Secondary | ICD-10-CM

## 2016-09-27 ENCOUNTER — Ambulatory Visit (HOSPITAL_BASED_OUTPATIENT_CLINIC_OR_DEPARTMENT_OTHER)
Admission: RE | Admit: 2016-09-27 | Discharge: 2016-09-27 | Disposition: A | Discharge status: Home / Self Care | Payer: Managed Care, Other (non HMO) | Source: Ambulatory Visit | Attending: Obstetrics and Gynecology | Admitting: Obstetrics and Gynecology

## 2016-09-27 ENCOUNTER — Encounter (HOSPITAL_BASED_OUTPATIENT_CLINIC_OR_DEPARTMENT_OTHER): Payer: Self-pay

## 2016-09-27 DIAGNOSIS — Z1231 Encounter for screening mammogram for malignant neoplasm of breast: Secondary | ICD-10-CM | POA: Insufficient documentation

## 2017-09-04 ENCOUNTER — Other Ambulatory Visit (HOSPITAL_BASED_OUTPATIENT_CLINIC_OR_DEPARTMENT_OTHER): Payer: Self-pay | Admitting: Obstetrics and Gynecology

## 2017-09-04 DIAGNOSIS — Z1231 Encounter for screening mammogram for malignant neoplasm of breast: Secondary | ICD-10-CM

## 2017-10-03 ENCOUNTER — Ambulatory Visit (HOSPITAL_BASED_OUTPATIENT_CLINIC_OR_DEPARTMENT_OTHER): Payer: Managed Care, Other (non HMO)

## 2017-10-10 ENCOUNTER — Encounter (HOSPITAL_BASED_OUTPATIENT_CLINIC_OR_DEPARTMENT_OTHER): Payer: Self-pay

## 2017-10-10 ENCOUNTER — Ambulatory Visit (HOSPITAL_BASED_OUTPATIENT_CLINIC_OR_DEPARTMENT_OTHER)
Admission: RE | Admit: 2017-10-10 | Discharge: 2017-10-10 | Disposition: A | Discharge status: Home / Self Care | Payer: 59 | Source: Ambulatory Visit | Attending: Obstetrics and Gynecology | Admitting: Obstetrics and Gynecology

## 2017-10-10 DIAGNOSIS — Z1231 Encounter for screening mammogram for malignant neoplasm of breast: Secondary | ICD-10-CM | POA: Diagnosis present

## 2018-03-02 ENCOUNTER — Other Ambulatory Visit: Payer: Self-pay | Admitting: Physician Assistant

## 2018-11-22 ENCOUNTER — Other Ambulatory Visit (HOSPITAL_BASED_OUTPATIENT_CLINIC_OR_DEPARTMENT_OTHER): Payer: Self-pay | Admitting: Nurse Practitioner

## 2018-11-22 DIAGNOSIS — Z1231 Encounter for screening mammogram for malignant neoplasm of breast: Secondary | ICD-10-CM

## 2018-11-29 ENCOUNTER — Other Ambulatory Visit: Payer: Self-pay

## 2018-11-29 ENCOUNTER — Ambulatory Visit (HOSPITAL_BASED_OUTPATIENT_CLINIC_OR_DEPARTMENT_OTHER)
Admission: RE | Admit: 2018-11-29 | Discharge: 2018-11-29 | Disposition: A | Discharge status: Home / Self Care | Payer: 59 | Source: Ambulatory Visit | Attending: Nurse Practitioner | Admitting: Nurse Practitioner

## 2018-11-29 ENCOUNTER — Ambulatory Visit (HOSPITAL_BASED_OUTPATIENT_CLINIC_OR_DEPARTMENT_OTHER): Payer: 59

## 2018-11-29 DIAGNOSIS — Z1231 Encounter for screening mammogram for malignant neoplasm of breast: Secondary | ICD-10-CM | POA: Insufficient documentation

## 2019-06-05 ENCOUNTER — Other Ambulatory Visit: Payer: Self-pay | Admitting: Obstetrics and Gynecology

## 2019-06-05 DIAGNOSIS — E2839 Other primary ovarian failure: Secondary | ICD-10-CM

## 2019-06-14 DIAGNOSIS — U071 COVID-19: Secondary | ICD-10-CM

## 2019-06-14 HISTORY — DX: COVID-19: U07.1

## 2019-09-05 ENCOUNTER — Telehealth: Payer: Self-pay | Admitting: Emergency Medicine

## 2019-09-05 ENCOUNTER — Emergency Department (INDEPENDENT_AMBULATORY_CARE_PROVIDER_SITE_OTHER)
Admission: EM | Admit: 2019-09-05 | Discharge: 2019-09-05 | Disposition: A | Discharge status: Home / Self Care | Payer: 59 | Source: Home / Self Care

## 2019-09-05 DIAGNOSIS — T782XXA Anaphylactic shock, unspecified, initial encounter: Secondary | ICD-10-CM | POA: Diagnosis not present

## 2019-09-05 MED ORDER — EPINEPHRINE 0.3 MG/0.3ML IJ SOAJ
0.3000 mg | Freq: Once | INTRAMUSCULAR | Status: AC
  Administered 2019-09-05: 13:00:00 0.3 mg via INTRAMUSCULAR

## 2019-09-05 MED ORDER — FAMOTIDINE 40 MG/5ML PO SUSR
40.0000 mg | ORAL | Status: AC
  Administered 2019-09-05: 13:00:00 40 mg via ORAL

## 2019-09-05 MED ORDER — DIPHENHYDRAMINE HCL 25 MG PO CAPS
25.0000 mg | ORAL_CAPSULE | Freq: Once | ORAL | Status: AC
  Administered 2019-09-05: 13:00:00 25 mg via ORAL

## 2019-09-05 MED ORDER — METHYLPREDNISOLONE SODIUM SUCC 40 MG IJ SOLR
80.0000 mg | Freq: Once | INTRAMUSCULAR | Status: AC
  Administered 2019-09-05: 13:00:00 80 mg via INTRAMUSCULAR

## 2019-09-05 NOTE — Discharge Instructions (Signed)
  You have declined EMS transport, however, due to the continued sensation of throat swelling and need for at least 1 dose of EpiPen, it is advised you continue onto the hospital for further evaluation, treatment and monitoring of your symptoms. Please have your husband drive you directly to [facility] Emergency [facility].

## 2019-09-05 NOTE — ED Provider Notes (Signed)
[facility]    CSN: [id number] Arrival date & time: 09/05/19  1241      History   Chief Complaint Chief Complaint  Patient presents with  . Allergic Reaction    HPI Connie Campos is a 64 y.o. female.   HPI Connie Campos is a 64 y.o. female presenting to UC with c/o sudden onset throat swelling, mouth dryness, itchy red hives over her arms, trunk and back. She has also noticed swelling of her upper lip and Left upper eyelid. No known allergies. 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  She reports standing under a blooming tree just prior to symptoms starting, about 20 minutes PTA. Pt took 25mg  benadryl about 60min PTA w/o relief. No hx of allergic reactions in the past. Denies nausea or vomiting. No new medications, food, soaps or lotions.  She is currently being worked up for palpitations, believed to be secondary to untreated sleep apnea.  She has not been started on any heart medication.    Past Medical History:  Diagnosis Date  . Herniated disc   . History of herniated intervertebral disc     There are no problems to display for this patient.   Past Surgical History:  Procedure Laterality Date  . BREAST BIOPSY Right   . FOOT SURGERY      OB History   No obstetric history on file.      Home Medications    Prior to Admission medications   Medication Sig Start Date End Date Taking? Authorizing Provider  amoxicillin (AMOXIL) 875 MG tablet Take 1 tablet (875 mg total) by mouth 2 (two) times daily. (Rx void after 09/13/16) 09/05/16   Kandra Nicolas, MD  guaiFENesin-codeine 100-10 MG/5ML syrup Take 35mL by mouth at bedtime as needed for cough 09/05/16   Kandra Nicolas, MD  naproxen sodium (ANAPROX) 220 MG tablet Take 220 mg by mouth as needed.    [provider]  predniSONE (DELTASONE) 20 MG tablet Take one tab by mouth twice daily for 5 days, then one daily for 3 days. Take with  food. 09/05/16   Kandra Nicolas, MD    Family History Family History  Problem Relation Age of Onset  . Diabetes Mother   . Cancer Father        lung  . Diabetes Sister   . Cancer Sister 25       Breast  . Diabetes Brother     Social History Social History   Tobacco Use  . Smoking status: Former Smoker    Packs/day: 0.10    Years: 10.00    Pack years: 1.00  . Smokeless tobacco: Never Used  Substance Use Topics  . Alcohol use: Yes    Alcohol/week: 4.0 standard drinks    Types: 4 Glasses of wine per week  . Drug use: No     Allergies   Miconazole nitrate   Review of Systems Review of Systems  Constitutional: Negative for diaphoresis.  HENT: Positive for rhinorrhea, sore throat (throat tightness/dryness "it feels like something is stuck in my throat") and trouble swallowing. Negative for voice change.   Respiratory: Positive for shortness of breath. Negative for chest tightness and wheezing.   Cardiovascular: Negative for chest pain and palpitations.  Gastrointestinal: Negative for diarrhea, nausea and vomiting.  Musculoskeletal: Negative for arthralgias and myalgias.  Skin: Positive for rash.  Neurological: Negative for dizziness, light-headedness and headaches.  Physical Exam Triage Vital Signs ED Triage Vitals  Enc Vitals Group     BP 09/05/19 1245 (!) 157/101     Pulse Rate 09/05/19 1245 82     Resp 09/05/19 1245 20     Temp 09/05/19 1245 97.6 F (36.4 C)     Temp Source 09/05/19 1245 Oral     SpO2 09/05/19 1245 97 %     Weight --      Height --      Head Circumference --      Peak Flow --      Pain Score 09/05/19 1246 0     Pain Loc --      Pain Edu? --      Excl. in GC? --    No data found.  Updated Vital Signs BP (!) 159/83 (BP Location: Right Arm)   Pulse 80   Temp 97.6 F (36.4 C) (Oral)   Resp 20   SpO2 98%   Visual Acuity Right Eye Distance:   Left Eye Distance:   Bilateral Distance:    Right Eye Near:   Left Eye Near:      Bilateral Near:     Physical Exam Vitals and nursing note reviewed.  Constitutional:      Appearance: Normal appearance. She is well-developed.     Comments: Pt sitting in exam chair, appears anxious but is alert and cooperative during exam.  HENT:     Head: Normocephalic and atraumatic.     Right Ear: Tympanic membrane, ear canal and external ear normal.     Left Ear: Tympanic membrane, ear canal and external ear normal.     Nose: Rhinorrhea ( mild, clear) present.     Mouth/Throat:     Mouth: Mucous membranes are dry.     Pharynx: Oropharynx is clear.  Cardiovascular:     Rate and Rhythm: Normal rate and regular rhythm.  Pulmonary:     Effort: Pulmonary effort is normal.     Breath sounds: Normal breath sounds.  Abdominal:     General: There is no distension.     Palpations: Abdomen is soft.     Tenderness: There is no abdominal tenderness.  Musculoskeletal:        General: Normal range of motion.     Cervical back: Normal range of motion.  Skin:    General: Skin is warm and dry.     Findings: Rash present.     Comments: Mild diffuse erythematous urticaria on arms, trunk and back.   Neurological:     General: No focal deficit present.     Mental Status: She is alert and oriented to person, place, and time.  Psychiatric:        Mood and Affect: Mood is anxious.        Behavior: Behavior normal.      UC Treatments / Results  Labs (all labs ordered are listed, but only abnormal results are displayed) Labs Reviewed - No data to display  EKG   Radiology No results found.  Procedures Procedures (including critical care time)  Medications Ordered in UC Medications  methylPREDNISolone sodium succinate (SOLU-MEDROL) 40 mg/mL injection 80 mg (80 mg Intramuscular Given 09/05/19 1254)  famotidine (PEPCID) 40 MG/5ML suspension 40 mg (40 mg Oral Given 09/05/19 1255)  diphenhydrAMINE (BENADRYL) capsule 25 mg (25 mg Oral Given 09/05/19 1305)  EPINEPHrine (EPI-PEN)  injection 0.3 mg (0.3 mg Intramuscular Given 09/05/19 1317)    Initial Impression / Assessment and Plan /  UC Course  I have reviewed the triage vital signs and the nursing notes.  Pertinent labs & imaging results that were available during my care of the patient were reviewed by me and considered in my medical decision making (see chart for details).    Hx and exam concerning for anaphylaxis  Cause- unknown. Pt initially given solumedrol 80mg  IM and pepcid 40mg  PO, monitored for 20-25 minutes without relief. Hives continued to form, pt denies relief of throat swelling/dryness sensation Pt clearing her throat every few seconds despite drinking water. Decision was made to give one EpiPen 0.3mg  Pt monitored another 10-15 minutes, rash and lip swelling starting to improve, but pt states c/o throat dryness. Strongly encouraged further evaluation and monitoring in emergency department  Pt declined EMS transport but agreeable to have husband drive her to [facility]. Pt discharged in stable condition.  Final Clinical Impressions(s) / UC Diagnoses   Final diagnoses:  Anaphylaxis, initial encounter     Discharge Instructions      You have declined EMS transport, however, due to the continued sensation of throat swelling and need for at least 1 dose of EpiPen, it is advised you continue onto the hospital for further evaluation, treatment and monitoring of your symptoms. Please have your husband drive you directly to [facility] Emergency Department.    ED Prescriptions    None     PDMP not reviewed this encounter.   [facility], [facility] 09/05/19 1335

## 2019-09-05 NOTE — ED Triage Notes (Signed)
Pt c/o possible allergic rxn that started while at work after a stressful situation. Says throat is dry, rash, trouble breathing. No known allergies other than season. Benedryl taken 30 mins ago.

## 2019-09-10 ENCOUNTER — Other Ambulatory Visit: Payer: 59

## 2019-10-28 ENCOUNTER — Ambulatory Visit: Payer: 59 | Attending: Internal Medicine

## 2019-10-28 DIAGNOSIS — Z23 Encounter for immunization: Secondary | ICD-10-CM

## 2019-10-28 NOTE — Progress Notes (Signed)
   Covid-19 Vaccination Clinic  Name:  Millena Callins    MRN: [id number] DOB: 18-Apr-1956  10/28/2019  Ms. Cadiente was observed post Covid-19 immunization for 15 minutes without incident. She was provided with Vaccine Information Sheet and instruction to access the V-Safe system.   Ms. Casa was instructed to call 911 with any severe reactions post vaccine: Marland Kitchen Difficulty breathing  . Swelling of face and throat  . A fast heartbeat  . A bad rash all over body  . Dizziness and weakness   Immunizations Administered    Name Date Dose VIS Date Route   Pfizer COVID-19 Vaccine 10/28/2019  1:45 PM 0.3 mL 08/07/2018 Intramuscular   Manufacturer: [employer], [employer]   Lot: [id number]   NDC: [address]-7

## 2019-11-18 ENCOUNTER — Ambulatory Visit: Payer: 59 | Attending: Internal Medicine

## 2019-11-18 DIAGNOSIS — Z23 Encounter for immunization: Secondary | ICD-10-CM

## 2019-11-18 NOTE — Progress Notes (Signed)
   Covid-19 Vaccination Clinic  Name:  Connie Campos    MRN: [id number] DOB: [birth year]  11/18/2019  Connie Campos was observed post Covid-19 immunization for 30 minutes based on pre-vaccination screening without incident. She was provided with Vaccine Information Sheet and instruction to access the V-Safe system.   Connie Campos was instructed to call 911 with any severe reactions post vaccine: Marland Kitchen Difficulty breathing  . Swelling of face and throat  . A fast heartbeat  . A bad rash all over body  . Dizziness and weakness   Immunizations Administered    Name Date Dose VIS Date Route   Pfizer COVID-19 Vaccine 11/18/2019  1:39 PM 0.3 mL 08/07/2018 Intramuscular   Manufacturer: [employer], [employer]   Lot: [id number]   NDC: [address]-0

## 2019-11-25 ENCOUNTER — Other Ambulatory Visit (HOSPITAL_BASED_OUTPATIENT_CLINIC_OR_DEPARTMENT_OTHER): Payer: Self-pay | Admitting: Obstetrics and Gynecology

## 2019-11-25 DIAGNOSIS — Z1231 Encounter for screening mammogram for malignant neoplasm of breast: Secondary | ICD-10-CM

## 2019-12-31 ENCOUNTER — Other Ambulatory Visit: Payer: Self-pay

## 2019-12-31 ENCOUNTER — Encounter (HOSPITAL_BASED_OUTPATIENT_CLINIC_OR_DEPARTMENT_OTHER): Payer: Self-pay

## 2019-12-31 ENCOUNTER — Ambulatory Visit (HOSPITAL_BASED_OUTPATIENT_CLINIC_OR_DEPARTMENT_OTHER)
Admission: RE | Admit: 2019-12-31 | Discharge: 2019-12-31 | Disposition: A | Discharge status: Home / Self Care | Payer: 59 | Source: Ambulatory Visit | Attending: Obstetrics and Gynecology | Admitting: Obstetrics and Gynecology

## 2019-12-31 DIAGNOSIS — Z1231 Encounter for screening mammogram for malignant neoplasm of breast: Secondary | ICD-10-CM

## 2020-04-10 ENCOUNTER — Other Ambulatory Visit: Payer: Self-pay

## 2020-04-10 ENCOUNTER — Emergency Department (INDEPENDENT_AMBULATORY_CARE_PROVIDER_SITE_OTHER)
Admission: EM | Admit: 2020-04-10 | Discharge: 2020-04-10 | Disposition: A | Discharge status: Home / Self Care | Payer: 59 | Source: Home / Self Care | Attending: Family Medicine | Admitting: Family Medicine

## 2020-04-10 DIAGNOSIS — J069 Acute upper respiratory infection, unspecified: Secondary | ICD-10-CM

## 2020-04-10 DIAGNOSIS — Z1152 Encounter for screening for COVID-19: Secondary | ICD-10-CM

## 2020-04-10 MED ORDER — AZITHROMYCIN 250 MG PO TABS
ORAL_TABLET | ORAL | 0 refills | Status: DC
Start: 2020-04-10 — End: 2021-01-20

## 2020-04-10 MED ORDER — PREDNISONE 20 MG PO TABS: ORAL_TABLET | ORAL | 0 refills | Status: DC

## 2020-04-10 MED ORDER — BENZONATATE 200 MG PO CAPS: ORAL_CAPSULE | ORAL | 0 refills | Status: DC

## 2020-04-10 NOTE — Discharge Instructions (Addendum)
Take plain guaifenesin (1200mg  extended release tabs such as Mucinex) twice daily, with plenty of water, for cough and congestion.  May add Pseudoephedrine (30mg , one or two every 4 to 6 hours) for sinus congestion.  Get adequate rest.   May use albuterol inhaler as needed. Try warm salt water gargles for sore throat.  Stop all antihistamines for now, and other non-prescription cough/cold preparations. May take Tylenol as needed for headache, fever, etc.  Isolate yourself until COVID-19 test result is available.   If COVID-19 test is positive, isolate yourself until the below conditions are met: 1)  At least 10 days since symptoms onset. AND 2)  > 72 hours after symptom resolution (absence of fever without the use of fever-reducing medicine, and improvement in respiratory symptoms.

## 2020-04-10 NOTE — ED Triage Notes (Signed)
Pt states she has had a sore throat since Wednesday and a cough/congestion since yesterday. Pt states she has mild seasonal allergies and takes Flonase with good results. Pt is aox4 and ambulatory.

## 2020-04-10 NOTE — ED Provider Notes (Signed)
[facility]    CSN: [id number] Arrival date & time: 04/10/20  0911      History   Chief Complaint Chief Complaint  Patient presents with  . Sore Throat    since Wednesday  . Cough    since Yesterday    HPI Connie Campos is a 64 y.o. female.   Patient developed sore throat three days ago. Yesterday she developed sinus congestion, myalgias, chills, and a productive cough worse at night.  She believes that she has had some wheezing.   She denies chest tightness, shortness of breath, and changes in taste/smell. She has a past history of pneumonia.  She has seasonal rhinitis for which she takes Flonase.  The history is provided by the patient.    Past Medical History:  Diagnosis Date  . Herniated disc   . History of herniated intervertebral disc     There are no problems to display for this patient.   Past Surgical History:  Procedure Laterality Date  . BREAST BIOPSY Right   . FOOT SURGERY      OB History   No obstetric history on file.      Home Medications    Prior to Admission medications   Medication Sig Start Date End Date Taking? Authorizing Provider  amoxicillin (AMOXIL) 875 MG tablet Take 1 tablet (875 mg total) by mouth 2 (two) times daily. (Rx void after 09/13/16) 09/05/16   Lattie Haw, MD  guaiFENesin-codeine 100-10 MG/5ML syrup Take 72mL by mouth at bedtime as needed for cough 09/05/16   Lattie Haw, MD  naproxen sodium (ANAPROX) 220 MG tablet Take 220 mg by mouth as needed.    [provider]  predniSONE (DELTASONE) 20 MG tablet Take one tab by mouth twice daily for 5 days, then one daily for 3 days. Take with food. 09/05/16   Lattie Haw, MD    Family History Family History  Problem Relation Age of Onset  . Diabetes Mother   . Cancer Father        lung  . Diabetes Sister   . Cancer Sister 47       Breast  . Diabetes Brother     Social History Social History   Tobacco Use  . Smoking status: Former  Smoker    Packs/day: 0.10    Years: 10.00    Pack years: 1.00  . Smokeless tobacco: Never Used  Vaping Use  . Vaping Use: Never used  Substance Use Topics  . Alcohol use: Yes    Alcohol/week: 4.0 standard drinks    Types: 4 Glasses of wine per week  . Drug use: No     Allergies   Miconazole nitrate   Review of Systems Review of Systems  + sore throat + cough No pleuritic pain ? wheezing + nasal congestion + post-nasal drainage No sinus pain/pressure No itchy/red eyes No earache No hemoptysis No SOB No fever, + chills No nausea No vomiting No abdominal pain No diarrhea No urinary symptoms No skin rash + fatigue + myalgias No headache    Physical Exam Triage Vital Signs ED Triage Vitals  Enc Vitals Group     BP 04/10/20 0922 (!) 142/95     Pulse Rate 04/10/20 0922 95     Resp 04/10/20 0922 18     Temp 04/10/20 0922 98.7 F (37.1 C)     Temp Source 04/10/20 0922 Oral     SpO2 04/10/20 0922 98 %  Weight --      Height --      Head Circumference --      Peak Flow --      Pain Score 04/10/20 0925 7     Pain Loc --      Pain Edu? --      Excl. in GC? --    No data found.  Updated Vital Signs BP (!) 142/95 (BP Location: Left Arm)   Pulse 95   Temp 98.7 F (37.1 C) (Oral)   Resp 18   SpO2 98%   Visual Acuity Right Eye Distance:   Left Eye Distance:   Bilateral Distance:    Right Eye Near:   Left Eye Near:    Bilateral Near:     Physical Exam Nursing notes and Vital Signs reviewed. Appearance:  Patient appears stated age, and in no acute distress Eyes:  Pupils are equal, round, and reactive to light and accomodation.  Extraocular movement is intact.  Conjunctivae are not inflamed  Ears:  Canals normal.  Tympanic membranes normal.  Nose:  Mildly congested turbinates.  No sinus tenderness.  Pharynx:  Normal Neck:  Supple.  Mildly enlarged lateral nodes are present, tender to palpation on the left. Lungs:   Scattered rhonchi present.   Breath sounds are equal.  Moving air well. Heart:  Regular rate and rhythm without murmurs, rubs, or gallops.  Abdomen:  Nontender without masses or hepatosplenomegaly.  Bowel sounds are present.  No CVA or flank tenderness.  Extremities:  No edema.  Skin:  No rash present.   UC Treatments / Results  Labs (all labs ordered are listed, but only abnormal results are displayed) Labs Reviewed  NOVEL CORONAVIRUS, NAA    EKG   Radiology No results found.  Procedures Procedures (including critical care time)  Medications Ordered in UC Medications - No data to display  Initial Impression / Assessment and Plan / UC Course  I have reviewed the triage vital signs and the nursing notes.  Pertinent labs & imaging results that were available during my care of the patient were reviewed by me and considered in my medical decision making (see chart for details).    Because of patient's history of allergic rhinitis, and past history of pneumonia, will begin Z-pak and prednisone burst/taper.  Rx for Tessalon at bedtime. COVID PCR pending. Followup with Family Doctor if not improved in about 10 days.   Final Clinical Impressions(s) / UC Diagnoses   Final diagnoses:  Viral URI with cough  Encounter for screening for COVID-19     Discharge Instructions     Take plain guaifenesin (1200mg  extended release tabs such as Mucinex) twice daily, with plenty of water, for cough and congestion.  May add Pseudoephedrine (30mg , one or two every 4 to 6 hours) for sinus congestion.  Get adequate rest.   May use albuterol inhaler as needed. Try warm salt water gargles for sore throat.  Stop all antihistamines for now, and other non-prescription cough/cold preparations. May take Tylenol as needed for headache, fever, etc.      ED Prescriptions    None        , MD 04/13/20 1135

## 2020-04-11 LAB — NOVEL CORONAVIRUS, NAA: SARS-CoV-2, NAA: NOT DETECTED

## 2020-04-11 LAB — SARS-COV-2, NAA 2 DAY TAT

## 2020-04-13 ENCOUNTER — Telehealth: Payer: Self-pay | Admitting: Emergency Medicine

## 2020-04-13 ENCOUNTER — Emergency Department (INDEPENDENT_AMBULATORY_CARE_PROVIDER_SITE_OTHER): Payer: 59

## 2020-04-13 ENCOUNTER — Other Ambulatory Visit: Payer: Self-pay

## 2020-04-13 ENCOUNTER — Telehealth: Payer: Self-pay

## 2020-04-13 ENCOUNTER — Emergency Department (INDEPENDENT_AMBULATORY_CARE_PROVIDER_SITE_OTHER)
Admission: EM | Admit: 2020-04-13 | Discharge: 2020-04-13 | Disposition: A | Discharge status: Home / Self Care | Payer: 59 | Source: Home / Self Care

## 2020-04-13 DIAGNOSIS — R062 Wheezing: Secondary | ICD-10-CM

## 2020-04-13 DIAGNOSIS — R059 Cough, unspecified: Secondary | ICD-10-CM | POA: Diagnosis not present

## 2020-04-13 DIAGNOSIS — J069 Acute upper respiratory infection, unspecified: Secondary | ICD-10-CM | POA: Diagnosis not present

## 2020-04-13 MED ORDER — ALBUTEROL SULFATE HFA 108 (90 BASE) MCG/ACT IN AERS: 1.0000 | INHALATION_SPRAY | Freq: Four times a day (QID) | RESPIRATORY_TRACT | 0 refills | Status: DC | PRN

## 2020-04-13 NOTE — ED Notes (Signed)
Pt d/c'd by E. Doroteo Glassman, PA at approx 1123. She reports pt was a/o and w/o distress and that she advised her to go to the ED if her symptoms worsen today.

## 2020-04-13 NOTE — ED Provider Notes (Signed)
[facility]    CSN: [id number] Arrival date & time: 04/13/20  2952      History   Chief Complaint Chief Complaint  Patient presents with  . Cough    w/ sore throat    HPI Connie Campos is a 64 y.o. female.   HPI  Connie Campos is a 64 y.o. female presenting to UC with c/o continued worsening cough with congestion that started about 4-5 days ago. Pt was seen at [facility] on 04/10/20. She was prescribed prednisone, tessalon and azithromycin, which she has been taking but states she had a sensation of shortness of breath last night, described as pressure in her chest.  She does report taking "cough syrup" at night which contains hydrocodone, in addition to the medication that was prescribed 04/10/20.  She tried her albuterol inhaler, prescribed in January 2021 when she was dx with COVID but states she is not sure if she used it correctly; thinks she only had one puff left.  No prior hx of asthma or COPD.  She has been dx with sleep apnea but she was not able to tolerate the CPAP machine.  She has another sleep study coming up in December 2021.  She called her PCP who recommended she have a CXR.  Pt tested negative for COVID from her Friday visit as well as has a COVID test pending from [facility] earlier today.  Denies fever, chills, n/v/d.  She denies chest pain or SOB at this time but states her chest feels "tight." denies hx of CAD but states she had a cardiac cath earlier in the year, which was normal. States she had that performed because she "felt something in her chest."    Past Medical History:  Diagnosis Date  . COVID 06/2019  . Herniated disc   . History of herniated intervertebral disc     There are no problems to display for this patient.   Past Surgical History:  Procedure Laterality Date  . BREAST BIOPSY Right   . FOOT SURGERY      OB History   No obstetric history on file.      Home Medications    Prior to Admission medications   Medication  Sig Start Date End Date Taking? Authorizing Provider  albuterol (VENTOLIN HFA) 108 (90 Base) MCG/ACT inhaler Inhale 1-2 puffs into the lungs every 6 (six) hours as needed for wheezing or shortness of breath. 04/13/20   Lurene Shadow, PA-C  azithromycin (ZITHROMAX Z-PAK) 250 MG tablet Take 2 tabs today; then begin one tab once daily for 4 more days. 04/10/20   Lattie Haw, MD  benzonatate (TESSALON) 200 MG capsule Take one cap by mouth at bedtime as needed for cough.  May repeat in 4 to 6 hours 04/10/20   Lattie Haw, MD  naproxen sodium (ANAPROX) 220 MG tablet Take 220 mg by mouth as needed.    [provider]  predniSONE (DELTASONE) 20 MG tablet Take one tab by mouth twice daily for 4 days, then one daily for 3 days. Take with food. 04/10/20   Lattie Haw, MD    Family History Family History  Problem Relation Age of Onset  . Diabetes Mother   . Cancer Father        lung  . Diabetes Sister   . Cancer Sister 11       Breast  . Diabetes Brother     Social History Social History   Tobacco Use  . Smoking  status: Former Smoker    Packs/day: 0.10    Years: 10.00    Pack years: 1.00  . Smokeless tobacco: Never Used  Vaping Use  . Vaping Use: Never used  Substance Use Topics  . Alcohol use: Yes    Alcohol/week: 4.0 standard drinks    Types: 4 Glasses of wine per week    Comment: weekly  . Drug use: No     Allergies   Miconazole nitrate   Review of Systems Review of Systems  Constitutional: Negative for chills and fever.  HENT: Positive for congestion. Negative for ear pain, sore throat, trouble swallowing and voice change.   Respiratory: Positive for cough, chest tightness and wheezing. Negative for shortness of breath.   Cardiovascular: Negative for chest pain and palpitations.  Gastrointestinal: Negative for abdominal pain, diarrhea, nausea and vomiting.  Musculoskeletal: Negative for arthralgias, back pain and myalgias.  Skin: Negative for rash.    All other systems reviewed and are negative.    Physical Exam Triage Vital Signs ED Triage Vitals  Enc Vitals Group     BP 04/13/20 1011 (!) 158/96     Pulse Rate 04/13/20 1011 75     Resp 04/13/20 1011 20     Temp 04/13/20 1011 99 F (37.2 C)     Temp Source 04/13/20 1011 Oral     SpO2 04/13/20 1011 97 %     Weight --      Height --      Head Circumference --      Peak Flow --      Pain Score 04/13/20 1007 3     Pain Loc --      Pain Edu? --      Excl. in GC? --    No data found.  Updated Vital Signs BP (!) 155/94 (BP Location: Right Arm)   Pulse 75   Temp 99 F (37.2 C) (Oral)   Resp 20   SpO2 97%   Visual Acuity Right Eye Distance:   Left Eye Distance:   Bilateral Distance:    Right Eye Near:   Left Eye Near:    Bilateral Near:     Physical Exam Vitals and nursing note reviewed.  Constitutional:      General: She is not in acute distress.    Appearance: Normal appearance. She is well-developed. She is not ill-appearing, toxic-appearing or diaphoretic.  HENT:     Head: Normocephalic and atraumatic.     Right Ear: Tympanic membrane and ear canal normal.     Left Ear: Tympanic membrane and ear canal normal.     Nose: Nose normal.     Mouth/Throat:     Mouth: Mucous membranes are moist.     Pharynx: Oropharynx is clear.  Cardiovascular:     Rate and Rhythm: Normal rate and regular rhythm.  Pulmonary:     Effort: Pulmonary effort is normal. No respiratory distress.     Breath sounds: Wheezing and rhonchi present. No rales.     Comments: Rhonchi in lower lung fields with faint wheeze. No respiratory distress. Able to speak in full sentences without difficulty.  Musculoskeletal:        General: Normal range of motion.     Cervical back: Normal range of motion and neck supple.  Skin:    General: Skin is warm and dry.  Neurological:     Mental Status: She is alert and oriented to person, place, and time.  Psychiatric:  Behavior: Behavior normal.       UC Treatments / Results  Labs (all labs ordered are listed, but only abnormal results are displayed) Labs Reviewed - No data to display  EKG Date/Time:04/13/2020   11:01:39 Ventricular Rate: 62 PR Interval: 156 QRS Duration: 92 QT Interval: 400 QTC Calculation: 406 P-R-T axes: 62   41   37 Text Interpretation: Normal sinus rhythm. ST & T wave abnormality, consider anterior ischemia. Abnormal ECG  EKG from 09/05/19 no significant change.  Pt had Cardiac catheterization 10/23/2019- coronary arteries were normal with no significant disease. Left ventriculography was normal with an EF of 60-70.  Radiology DG Chest 2 View  Result Date: 04/13/2020 CLINICAL DATA:  Cough. EXAM: CHEST - 2 VIEW COMPARISON:  None. FINDINGS: The heart size and mediastinal contours are within normal limits. Both lungs are clear. The visualized skeletal structures are unremarkable. IMPRESSION: No active cardiopulmonary disease. Electronically Signed   By: Lupita Raider M.D.   On: 04/13/2020 10:34    Procedures Procedures (including critical care time)  Medications Ordered in UC Medications - No data to display  Initial Impression / Assessment and Plan / UC Course  I have reviewed the triage vital signs and the nursing notes.  Pertinent labs & imaging results that were available during my care of the patient were reviewed by me and considered in my medical decision making (see chart for details).    Discussed pt with Dr. Cathren Harsh, will have pt continue on prednisone and azithromycin. Will refill albuterol inhaler Will have pt discontinue reported hydrocodone cough syrup she has been taking at night. Encouraged to f/u with her pulmonologist this week, can also ensure proper use of her inhaler. Discussed symptoms that warrant emergent care in the ED. AVS given  Final Clinical Impressions(s) / UC Diagnoses   Final diagnoses:  Upper respiratory tract infection, unspecified type  Cough   Wheeze     Discharge Instructions      You may use the albuterol inhaler as prescribed.  Call to schedule a follow up appointment with your primary care provider later this week for recheck of symptoms if not improving. You may also want to schedule a follow up appointment with the Post-COVID care clinic to see if your persistent cough is related to your initial COVID infection earlier in the year.    Call 911 or have someone drive you to the hospital if symptoms significantly worsening including worsening trouble breathing, chest pain, dizziness, no relief with inhaler, or other new concerning symptoms develop.     ED Prescriptions    Medication Sig Dispense Auth. Provider   albuterol (VENTOLIN HFA) 108 (90 Base) MCG/ACT inhaler Inhale 1-2 puffs into the lungs every 6 (six) hours as needed for wheezing or shortness of breath. 18 g Lurene Shadow, PA-C     PDMP not reviewed this encounter.   Lurene Shadow, [place] 04/13/20 1621

## 2020-04-13 NOTE — ED Triage Notes (Signed)
Pt presents to Urgent Care with c/o continued/worsening cough, which began 4-5 days ago. She states last night she began wheezing and she is concerned that her breathing may be labored. Pt was seen here 3 days ago; negative COVID test. Pt has hx of COVID (January 2021) and is fully vaccinated.

## 2020-04-13 NOTE — Discharge Instructions (Signed)
  You may use the albuterol inhaler as prescribed.  Call to schedule a follow up appointment with your primary care provider later this week for recheck of symptoms if not improving. You may also want to schedule a follow up appointment with the Post-COVID care clinic to see if your persistent cough is related to your initial COVID infection earlier in the year.    Call 911 or have someone drive you to the hospital if symptoms significantly worsening including worsening trouble breathing, chest pain, dizziness, no relief with inhaler, or other new concerning symptoms develop.

## 2020-04-13 NOTE — Telephone Encounter (Signed)
Pt calls [facility] and leaves VM today at approx 1700. She states that E. Doroteo Glassman, [place] called to check up on her this afternoon after visit here this AM, and pt called back to give an update. Pt reports that she used her Albuterol inhaler and is feeling "a little better." She states she will take the advice of E. Doroteo Glassman, [place] and visit the ED if her symptoms worsen.

## 2020-04-13 NOTE — ED Notes (Signed)
1103 E. Doroteo Glassman, [place] notified of/shown EKG results. Pt is a/o and w/o distress, stating chest only hurts when she coughs. No new orders received at this time.

## 2020-04-13 NOTE — Telephone Encounter (Signed)
Called to check on pt to see how she is doing with inhaler, encouraged to call back.

## 2020-04-29 ENCOUNTER — Ambulatory Visit (INDEPENDENT_AMBULATORY_CARE_PROVIDER_SITE_OTHER): Payer: 59

## 2020-04-29 ENCOUNTER — Ambulatory Visit (INDEPENDENT_AMBULATORY_CARE_PROVIDER_SITE_OTHER): Payer: 59 | Admitting: Sports Medicine

## 2020-04-29 ENCOUNTER — Other Ambulatory Visit: Payer: Self-pay

## 2020-04-29 DIAGNOSIS — G8929 Other chronic pain: Secondary | ICD-10-CM | POA: Diagnosis not present

## 2020-04-29 DIAGNOSIS — M25562 Pain in left knee: Secondary | ICD-10-CM

## 2020-04-29 MED ORDER — MELOXICAM 15 MG PO TABS
ORAL_TABLET | ORAL | 3 refills | Status: DC
Start: 2020-04-29 — End: 2021-01-11

## 2020-04-29 NOTE — Assessment & Plan Note (Signed)
Pleasant 64 year old female, she has had pain in her left knee, anteriorly, worse going up and down stairs, she also has pain with terminal flexion. On exam she has pain over the patellar facets as well as pain with terminal flexion and a positive McMurray's sign with a lateral pop. 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 We will start conservatively as she is not really locking. X-rays, meloxicam, formal physical therapy, we discussed the anatomy and the treatment protocol. Return to see me in 6 weeks, MRI for surgical planning/injection if no better.

## 2020-04-29 NOTE — Progress Notes (Signed)
    Procedures performed today:    None.  Independent interpretation of notes and tests performed by another provider:   None.  Brief History, Exam, Impression, and Recommendations:    Chronic pain of left knee Pleasant 64 year old female, she has had pain in her left knee, anteriorly, worse going up and down stairs, she also has pain with terminal flexion. On exam she has pain over the patellar facets as well as pain with terminal flexion and a positive McMurray's sign with a lateral pop. 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 We will start conservatively as she is not really locking. X-rays, meloxicam, formal physical therapy, we discussed the anatomy and the treatment protocol. Return to see me in 6 weeks, MRI for surgical planning/injection if no better.    ___________________________________________ Ihor Austin. Benjamin Stain, M.D., ABFM., CAQSM. Primary Care and Sports Medicine [facility]  Adjunct Instructor of Family Medicine  [employer]

## 2020-04-29 NOTE — Patient Instructions (Signed)
Suspect patellofemoral syndrome and meniscal tear

## 2020-05-20 ENCOUNTER — Encounter: Payer: Self-pay | Admitting: Physical Therapy

## 2020-05-20 ENCOUNTER — Ambulatory Visit: Payer: 59 | Attending: Sports Medicine | Admitting: Physical Therapy

## 2020-05-20 ENCOUNTER — Other Ambulatory Visit: Payer: Self-pay

## 2020-05-20 DIAGNOSIS — R29898 Other symptoms and signs involving the musculoskeletal system: Secondary | ICD-10-CM | POA: Diagnosis present

## 2020-05-20 DIAGNOSIS — M25562 Pain in left knee: Secondary | ICD-10-CM | POA: Insufficient documentation

## 2020-05-20 DIAGNOSIS — G8929 Other chronic pain: Secondary | ICD-10-CM | POA: Insufficient documentation

## 2020-05-20 NOTE — Therapy (Addendum)
[facility] [address]  [place], [place], [address] Phone: [phone]   Fax:  [phone]  Physical Therapy Evaluation  Patient Details  Name: Connie Campos MRN: [id number] Date of Birth: 1955/09/16 Referring Provider (PT): Aundria Mems, MD   Encounter Date: 05/20/2020   PT End of Session - 05/20/20 1615    Visit Number 1    Number of Visits 7    Date for PT Re-Evaluation 07/01/20    Authorization Type [employer]    PT Start Time 1538    PT Stop Time 1612    PT Time Calculation (min) 34 min    Activity Tolerance Patient tolerated treatment well;Patient limited by pain    Behavior During Therapy [facility] for tasks assessed/performed           Past Medical History:  Diagnosis Date  . COVID 06/2019  . Herniated disc   . History of herniated intervertebral disc     Past Surgical History:  Procedure Laterality Date  . BREAST BIOPSY Right   . FOOT SURGERY      There were no vitals filed for this visit.    Subjective Assessment - 05/20/20 1539    Subjective Patient reports that she may have hyperextended her L knee about 6 weeks ago. It had been a little unstable and had been getting better. Feels like she tweaked it again and has been wearing a knee sleeve since. Today it is feeling better. Questioning how long she should be wearing the sleeve. Worse with prolonged sitting, stairs. Better with walking.    Pertinent History herniated disc, COVID, foot surgery    Limitations Lifting;Walking;Standing;Sitting;House hold activities    How long can you sit comfortably? pt unable to answer    How long can you stand comfortably? unlimited    How long can you walk comfortably? unlimited    Diagnostic tests 04/29/20 L knee xray: negative    Patient Stated Goals increase stability    Currently in Pain? Yes    Pain Score 0-No pain    Pain Location Knee    Pain Orientation Left    Pain Descriptors / Indicators  Aching    Pain Type Acute pain              [facility] PT Assessment - 05/20/20 1544      Assessment   Medical Diagnosis Chronic pain of L knee    Referring Provider (PT) Aundria Mems, MD    Onset Date/Surgical Date 04/08/20    Next MD Visit 06/10/20    Prior Therapy no      Balance Screen   Has the patient fallen in the past 6 months Yes    How many times? 1   slipped on leaves in the woods- no injury   Has the patient had a decrease in activity level because of a fear of falling?  No    Is the patient reluctant to leave their home because of a fear of falling?  No      Home Ecologist residence    Living Arrangements Spouse/significant other    Available Help at Discharge Family    Type of [place] to enter    Entrance Stairs-Number of Steps 2    Entrance Stairs-Rails None    Home Layout One level    [place] None      Prior Function   Level  of Independence Independent    Vocation Full time employment    Press photographer work, walking    Leisure hiking      Cognition   Overall Cognitive Status Within Functional Limits for tasks assessed      Observation/Other Assessments-Edema    Edema --   slightly edematous over L suprapatellar region     Sensation   Light Touch Appears Intact      Coordination   Gross Motor Movements are Fluid and Coordinated Yes      Posture/Postural Control   Posture/Postural Control Postural limitations    Postural Limitations Rounded Shoulders;Forward head      ROM / Strength   AROM / PROM / Strength AROM;PROM;Strength      AROM   AROM Assessment Site Knee    Right/Left Knee Right;Left    Right Knee Extension 0    Right Knee Flexion 132    Left Knee Extension 0    Left Knee Flexion 131      PROM   PROM Assessment Site Knee    Right/Left Knee Right;Left    Right Knee Extension -5    Right Knee Flexion 136    Left Knee Extension -3   discomfort    Left Knee Flexion 140   discomfort     Strength   Strength Assessment Site Hip;Knee;Ankle    Right/Left Hip Right;Left    Right Hip Flexion 4+/5    Right Hip ABduction 4+/5    Right Hip ADduction 4/5    Left Hip Flexion 4+/5    Left Hip Extension --    Left Hip ABduction 4+/5    Left Hip ADduction 4/5    Right/Left Knee Right;Left    Right Knee Flexion 4+/5    Right Knee Extension 4+/5    Left Knee Flexion 4+/5   discomfort   Left Knee Extension 4+/5   discomfort   Right/Left Ankle Right;Left    Right Ankle Dorsiflexion 4+/5    Right Ankle Plantar Flexion 4+/5    Left Ankle Dorsiflexion 4+/5    Left Ankle Plantar Flexion 4+/5      Flexibility   Soft Tissue Assessment /Muscle Length yes    Hamstrings B mildly tight    Quadriceps B mildly tigght    ITB L ober's test negative    Piriformis B WFL      Palpation   Patella mobility normal mobility in B patellae- slight apprehension with superior glide on L    Palpation comment no TTP over L knee      Ambulation/Gait   Assistive device None    Gait Pattern Within Functional Limits    Ambulation Surface Level;Indoor    Gait velocity WNL                      Objective measurements completed on examination: See above findings.               PT Education - 05/20/20 1615    Education Details prognosis, POC, HEP    Person(s) Educated Patient    Methods Explanation;Demonstration;Tactile cues;Verbal cues;Handout    Comprehension Returned demonstration;Verbalized understanding            PT Short Term Goals - 05/20/20 1634      PT SHORT TERM GOAL #1   Title Patient to be independent with initial HEP.    Time 2    Period Weeks    Status New    Target  Date 06/03/20             PT Long Term Goals - 05/20/20 1634      PT LONG TERM GOAL #1   Title Patient to be independent with advanced  HEP.    Time 6    Period Weeks    Status New    Target Date 07/01/20      PT LONG TERM GOAL #2    Title Patient to report 90% improvement in L knee stability.    Time 6    Period Weeks    Status New    Target Date 07/01/20      PT LONG TERM GOAL #3   Title Patient to demonstrate stair climbing up/down 13 steps with handrail as needed with good eccentric control on L LE.    Time 6    Period Weeks    Status New    Target Date 07/01/20      PT LONG TERM GOAL #4   Title Patient to report return to walking for exercise without knee discomfort limiting.    Time 6    Period Weeks    Status New    Target Date 07/01/20                  Plan - 05/20/20 1615    Clinical Impression Statement Patient is a 64 y/o F presenting to OPPT with c/o L knee pain after she suspects that shje hyperextended it 6 weeks ago. Reports the feeling of instability which improved with the use of a knee sleeve. Worse with prolonged sitting and stairs. Patient today presenting with rounded shoulders, B knee hyperextension with pain at end ranges of L knee PROM, B hip adduction weakness, mild tightness in B LEs, and apprehension with L superior patellar glide. Patient was educated on gentle strengthening HEP- patient reported understanding. Would benefit from skilled PT services 1x/week for 6 weeks to address aforementioned impairments.    Personal Factors and Comorbidities Age;Comorbidity 3+;Fitness;Past/Current Experience;Profession;Time since onset of injury/illness/exacerbation    Comorbidities herniated disc, COVID, foot surgery    Examination-Activity Limitations Sit;Bend;Squat;Stairs;Transfers;Lift    Examination-Participation Restrictions Church;Cleaning;Community Activity;Shop;Yard Work;Laundry;Meal Prep;Occupation    Stability/Clinical Decision Making Stable/Uncomplicated    Clinical Decision Making Low    Rehab Potential Good    PT Frequency 1x / week    PT Duration 6 weeks    PT Treatment/Interventions ADLs/Self Care Home Management;Cryotherapy;Electrical Stimulation;Iontophoresis 35m/ml  Dexamethasone;Moist Heat;Balance training;Therapeutic exercise;Therapeutic activities;Functional mobility training;Stair training;Gait training;Ultrasound;Neuromuscular re-education;Cognitive remediation;Patient/family education;Manual techniques;Vasopneumatic Device;Taping;Energy conservation;Dry needling;Passive range of motion    PT Next Visit Plan knee FOTO; reassess HEP; progress LE strengthening and stability    Consulted and Agree with Plan of Care Patient           Patient will benefit from skilled therapeutic intervention in order to improve the following deficits and impairments:  Decreased activity tolerance, Pain, Increased fascial restricitons, Decreased strength, Decreased balance, Improper body mechanics, Postural dysfunction, Impaired flexibility, Hypermobility  Visit Diagnosis: Chronic pain of left knee  Other symptoms and signs involving the musculoskeletal system     Problem List Patient Active Problem List   Diagnosis Date Noted  . Chronic pain of left knee 04/29/2020     YJanene Harvey PT, DPT 05/20/20 5:54 PM   [facility] [address] [place] [address]Phone: [phone]  Fax:  [phone] Name: Connie CrossleyMRN: [id number] of Birth: 402/03/1956  PHYSICAL THERAPY DISCHARGE SUMMARY  Visits from Start of Care: 1  Current functional level related to goals / functional outcomes: See above clinical impression; patient did not return d/t report of not doing her exercises   Remaining deficits: See above   Education / Equipment: HEP  Plan: Patient agrees to discharge.  Patient goals were not met. Patient is being discharged due to the patient's request.  ?????     Janene Harvey, PT, DPT 06/04/20 1:04 PM

## 2020-05-27 ENCOUNTER — Ambulatory Visit: Payer: 59

## 2020-06-03 ENCOUNTER — Encounter: Payer: 59 | Admitting: Physical Therapy

## 2020-06-10 ENCOUNTER — Ambulatory Visit (INDEPENDENT_AMBULATORY_CARE_PROVIDER_SITE_OTHER): Payer: 59 | Admitting: Sports Medicine

## 2020-06-10 ENCOUNTER — Encounter: Payer: Self-pay | Admitting: Sports Medicine

## 2020-06-10 ENCOUNTER — Other Ambulatory Visit: Payer: Self-pay

## 2020-06-10 DIAGNOSIS — M25562 Pain in left knee: Secondary | ICD-10-CM

## 2020-06-10 DIAGNOSIS — G8929 Other chronic pain: Secondary | ICD-10-CM | POA: Diagnosis not present

## 2020-06-10 NOTE — Progress Notes (Signed)
    Procedures performed today:    None.  Independent interpretation of notes and tests performed by another provider:   None.  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 She has done really well with meloxicam, she did a session of therapy, at this point she still has only minimal pain, this is well controlled with occasional ibuprofen and occasional meloxicam, she would like to get more diligent with her home rehab exercises before considering interventional treatment. Return to see me on an as-needed basis.    ___________________________________________ Ihor Austin. Benjamin Stain, M.D., ABFM., CAQSM. Primary Care and Sports Medicine [facility]  Adjunct Instructor of Family Medicine  [employer]

## 2020-06-10 NOTE — Assessment & Plan Note (Signed)
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 She has done really well with meloxicam, she did a session of therapy, at this point she still has only minimal pain, this is well controlled with occasional ibuprofen and occasional meloxicam, she would like to get more diligent with her home rehab exercises before considering interventional treatment. Return to see me on an as-needed basis.

## 2020-06-17 ENCOUNTER — Encounter: Payer: 59 | Admitting: Physical Therapy

## 2020-08-23 IMAGING — MG DIGITAL SCREENING BILAT W/ TOMO W/ CAD
6 of 10 series · 6 of 30 positions shown · non-contrast
Comparison: Previous exam(s).

CLINICAL DATA: Screening.

EXAM:
DIGITAL SCREENING BILATERAL MAMMOGRAM WITH TOMO AND CAD

[L MLO synth-2D (1 of 2)]
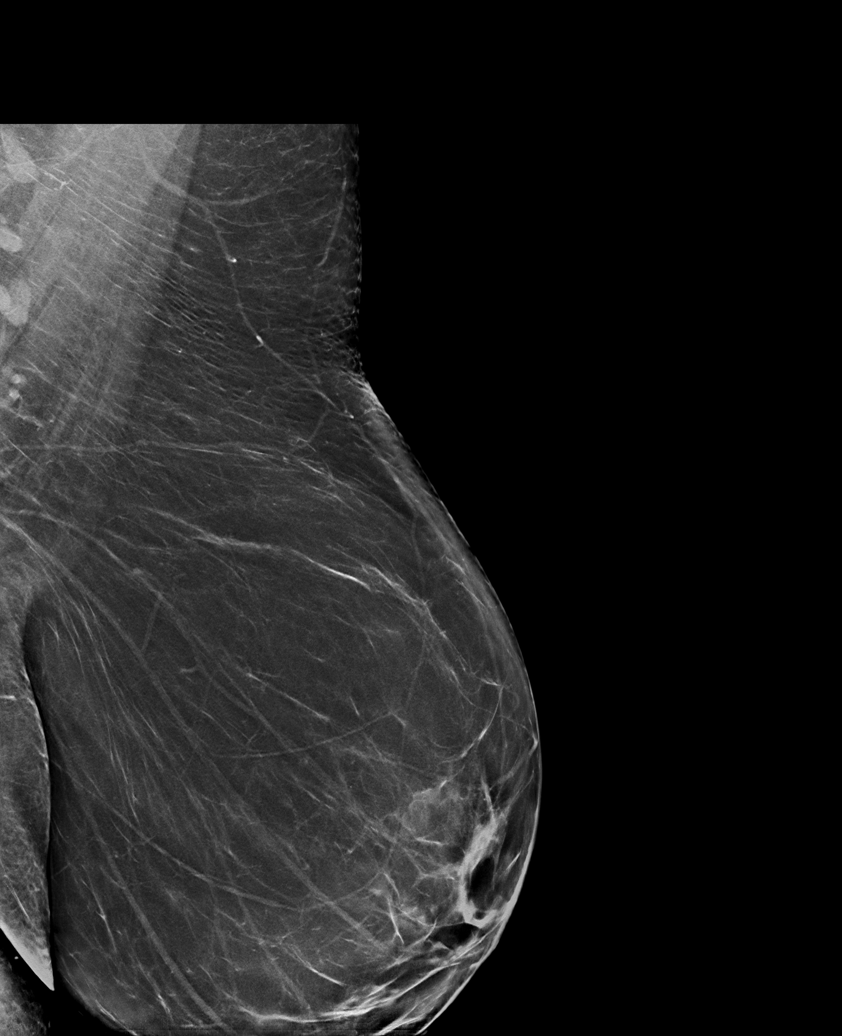

[L CC synth-2D]
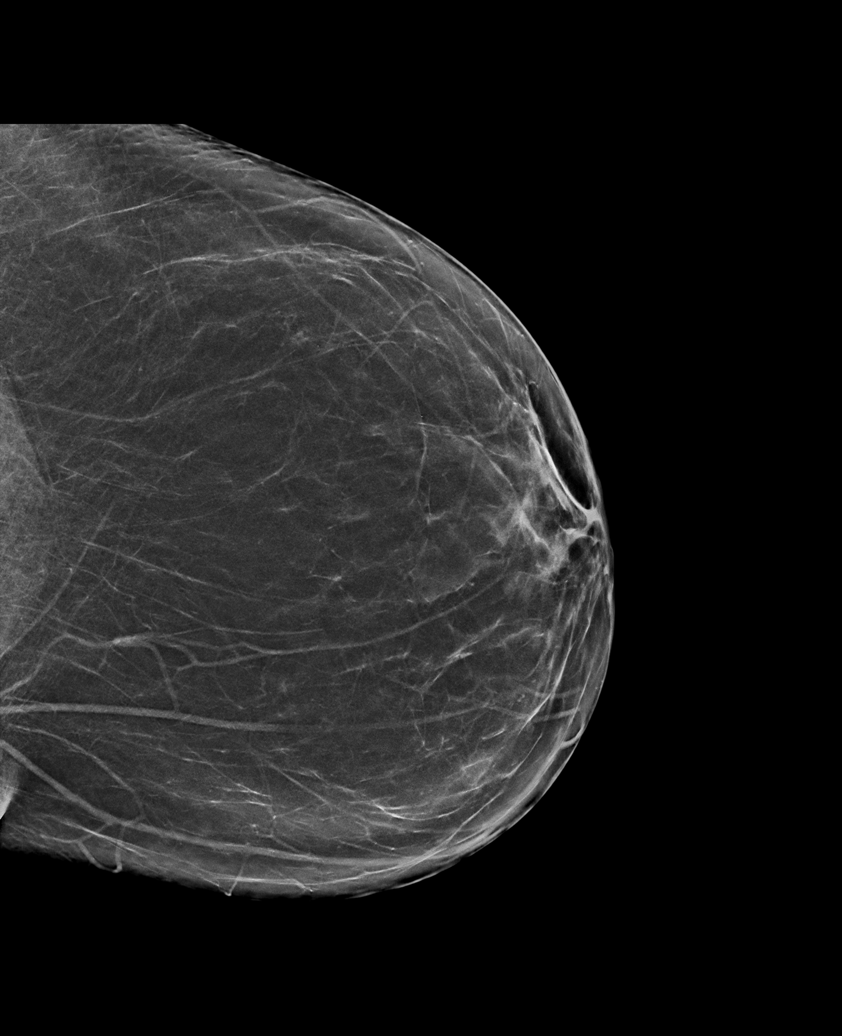

[R CC synth-2D]
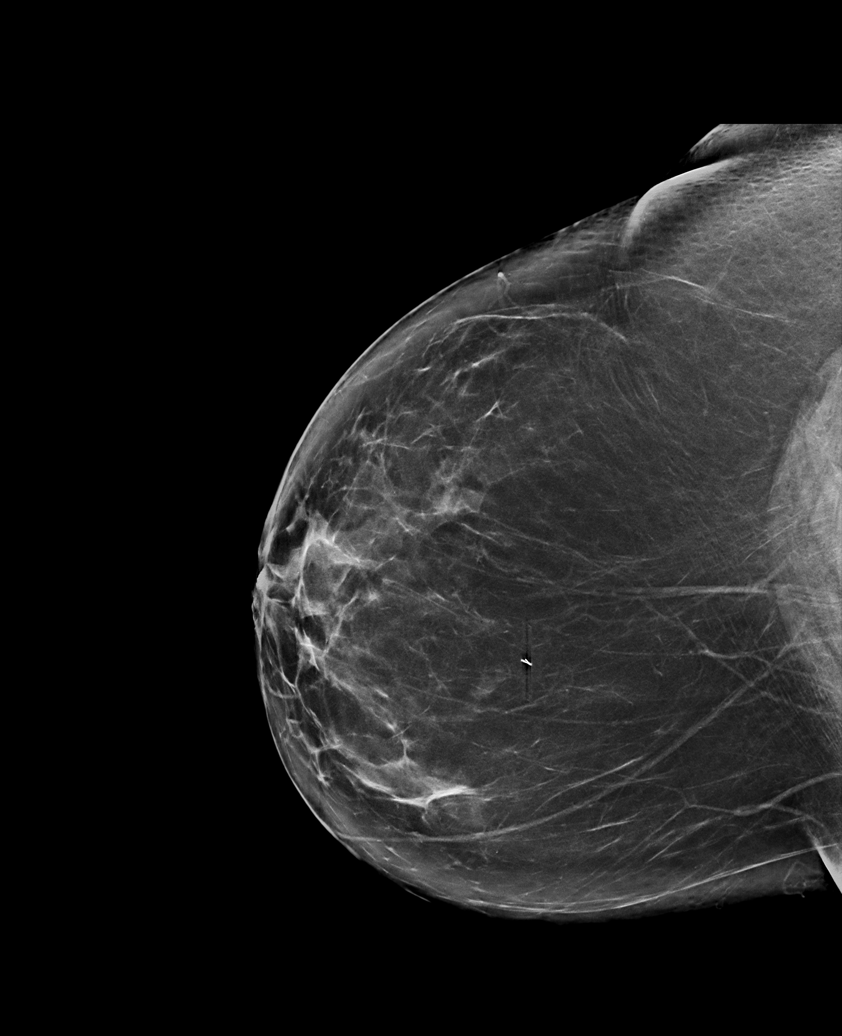

[R MLO synth-2D]
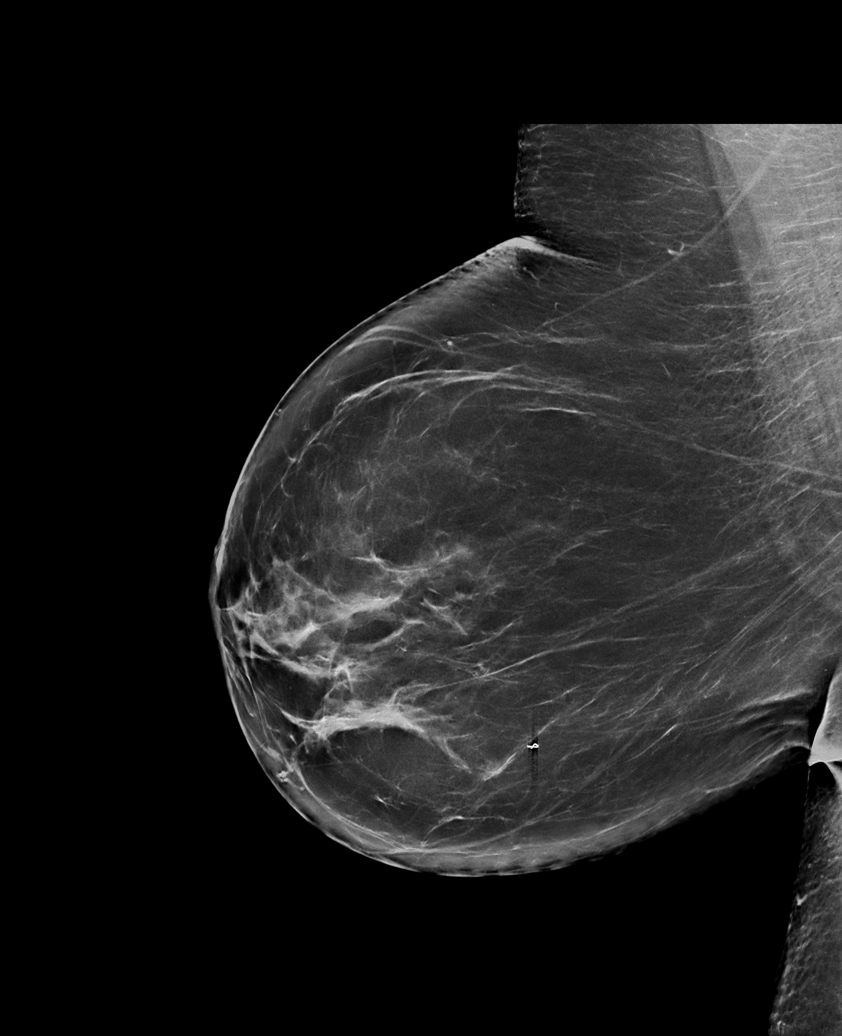

[L MLO synth-2D (2 of 2)]
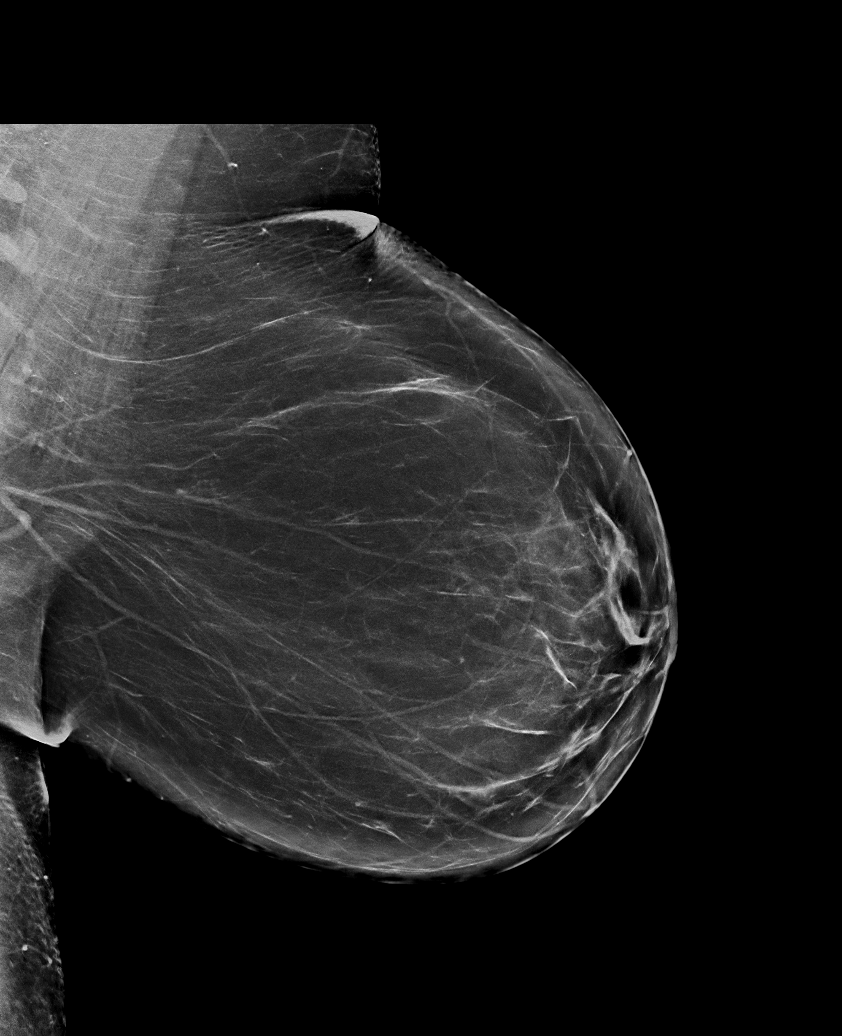

[L MLO tomo · tomo slice 41/81.0]
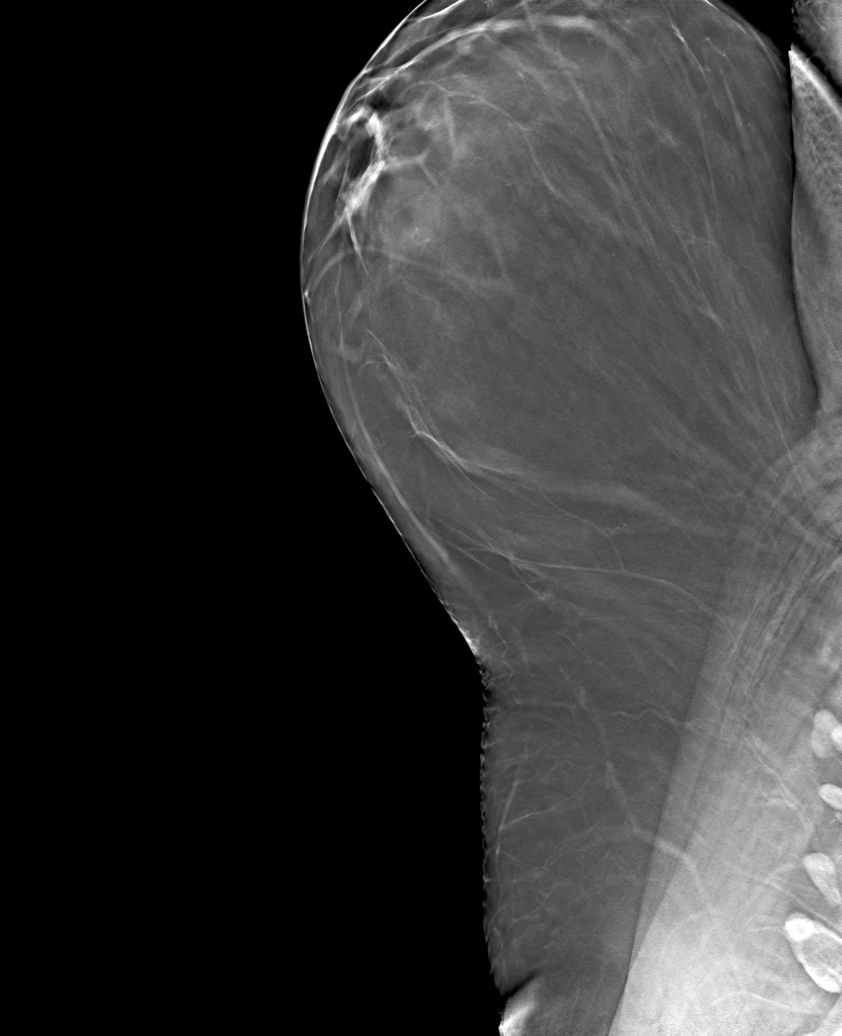

[6 of 30 positions shown; findings below may reference images not displayed]

ACR Breast Density Category b: There are scattered areas of
fibroglandular density.
FINDINGS: There are no findings suspicious for malignancy. Images were
processed with CAD.
IMPRESSION: No mammographic evidence of malignancy. A result letter of this
screening mammogram will be mailed directly to the patient.

RECOMMENDATION:
Screening mammogram in one year. (Code:[id number])

BI-RADS CATEGORY  1: Negative.

## 2020-10-15 ENCOUNTER — Ambulatory Visit (HOSPITAL_COMMUNITY): Payer: 59 | Admitting: Licensed Clinical Social Worker

## 2020-10-21 ENCOUNTER — Ambulatory Visit (INDEPENDENT_AMBULATORY_CARE_PROVIDER_SITE_OTHER): Payer: 59 | Admitting: Licensed Clinical Social Worker

## 2020-10-21 DIAGNOSIS — F419 Anxiety disorder, unspecified: Secondary | ICD-10-CM

## 2020-10-21 DIAGNOSIS — Z630 Problems in relationship with spouse or partner: Secondary | ICD-10-CM
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
## 2020-11-18 ENCOUNTER — Ambulatory Visit (INDEPENDENT_AMBULATORY_CARE_PROVIDER_SITE_OTHER): Payer: 59 | Admitting: Licensed Clinical Social Worker

## 2020-11-18 DIAGNOSIS — F419 Anxiety disorder, unspecified: Secondary | ICD-10-CM

## 2020-11-19 NOTE — Progress Notes (Signed)
Virtual Visit via Video Note  I connected with Connie Campos on 11/19/20 at  9:00 AM EDT by a video enabled telemedicine application and verified that I am speaking with the correct person using two identifiers.  Location: Patient: Connie Campos Provider: Office   I discussed the limitations of evaluation and management by telemedicine and the availability of in person appointments. The patient expressed understanding and agreed to proceed.  THERAPIST PROGRESS NOTE  Session Time: 9:00 am-9:45 am  Type of Therapy: Individual Therapy  Purpose of session: "Connie "Fredia Campos" will manage anxiety as evidenced by coping with stressors, managing anxious thoughts, cope with grief/spouses health issues and coping with marital dynamics for 5 out of 7 days for 60 days."  Interventions: CBT and Solution Focused Therapist utilized CBT and Solution focused brief therapy with patient to address anxiety. Therapist provided support and empathy to patient during session. Therapist assessed patient's physical, spiritual, relationship, and emotional/mental needs. Therapist worked with patient to identify small steps to address physical and spiritual needs to increase control on life and reduce anxiety.   Effectiveness: Patient was oriented x5 (person, place, situation, time, and object). Patient was alert, engaged, cooperative, and pleasant. Patient was casually dressed, and appropriately groomed. Patient has had anxiety and is feeling concerned about her marriage. She doesn't know whether she should stay or go. Patient noted that her husband is very negative and says negative things about her as well. Patient feels like her eating has increased, her alcohol use has increased at times, her sleep is off, and she is not exercising. Patient noted that she needs to leave work on time and go for a walk twice a week. Patient is doing ok spiritually. She is praying but just goes through the motions. She feels like she needs to  Connie time aside for spiritual things and agreed to Connie time aside 3x a week to read or pray. Patient feels like she has lost herself and is working to get back to her old self. These steps will help her get back to herself.   Patient was engaged. She responded well to interventions. Patient continues to meet criteria for Anxiety disorder, unspecified type. Patient will continue in outpatient therapy due to being the least restrictive service to meet her needs at this time. Patient made minimal progress on her goals.   Suicidal/Homicidal: Negativewithout intent/plan   Plan: Return again in  1-2 weeks.  Diagnosis: Axis I: Anxiety disorder, unspecified type    Axis II: No diagnosis  I discussed the assessment and treatment plan with the patient. The patient was provided an opportunity to ask questions and all were answered. The patient agreed with the plan and demonstrated an understanding of the instructions.   The patient was advised to call back or seek an in-person evaluation if the symptoms worsen or if the condition fails to improve as anticipated.  I provided 45 minutes of non-face-to-face time during this encounter.  Bynum Bellows, LCSW 11/19/2020

## 2020-11-24 ENCOUNTER — Ambulatory Visit (INDEPENDENT_AMBULATORY_CARE_PROVIDER_SITE_OTHER): Payer: 59 | Admitting: Licensed Clinical Social Worker

## 2020-11-24 DIAGNOSIS — Z630 Problems in relationship with spouse or partner: Secondary | ICD-10-CM | POA: Diagnosis not present

## 2020-11-24 DIAGNOSIS — F419 Anxiety disorder, unspecified: Secondary | ICD-10-CM
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
## 2020-12-01 ENCOUNTER — Ambulatory Visit (HOSPITAL_COMMUNITY): Payer: 59 | Admitting: Licensed Clinical Social Worker

## 2020-12-04 ENCOUNTER — Other Ambulatory Visit (HOSPITAL_BASED_OUTPATIENT_CLINIC_OR_DEPARTMENT_OTHER): Payer: Self-pay | Admitting: Obstetrics and Gynecology

## 2020-12-04 DIAGNOSIS — Z1231 Encounter for screening mammogram for malignant neoplasm of breast: Secondary | ICD-10-CM

## 2020-12-09 ENCOUNTER — Ambulatory Visit (HOSPITAL_COMMUNITY): Payer: 59 | Admitting: Licensed Clinical Social Worker

## 2020-12-09 DIAGNOSIS — F419 Anxiety disorder, unspecified: Secondary | ICD-10-CM

## 2020-12-09 DIAGNOSIS — Z630 Problems in relationship with spouse or partner: Secondary | ICD-10-CM
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
## 2020-12-17 ENCOUNTER — Ambulatory Visit (INDEPENDENT_AMBULATORY_CARE_PROVIDER_SITE_OTHER): Payer: 59 | Admitting: Licensed Clinical Social Worker

## 2020-12-17 DIAGNOSIS — Z630 Problems in relationship with spouse or partner: Secondary | ICD-10-CM

## 2020-12-17 DIAGNOSIS — F419 Anxiety disorder, unspecified: Secondary | ICD-10-CM
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
## 2021-01-11 ENCOUNTER — Encounter (HOSPITAL_BASED_OUTPATIENT_CLINIC_OR_DEPARTMENT_OTHER): Payer: Self-pay

## 2021-01-11 ENCOUNTER — Other Ambulatory Visit: Payer: Self-pay

## 2021-01-11 ENCOUNTER — Ambulatory Visit (HOSPITAL_BASED_OUTPATIENT_CLINIC_OR_DEPARTMENT_OTHER)
Admission: RE | Admit: 2021-01-11 | Discharge: 2021-01-11 | Disposition: A | Discharge status: Home / Self Care | Payer: 59 | Source: Ambulatory Visit | Attending: Obstetrics and Gynecology | Admitting: Obstetrics and Gynecology

## 2021-01-11 ENCOUNTER — Encounter (INDEPENDENT_AMBULATORY_CARE_PROVIDER_SITE_OTHER): Payer: 59

## 2021-01-11 ENCOUNTER — Ambulatory Visit: Payer: 59 | Admitting: Sports Medicine

## 2021-01-11 DIAGNOSIS — Z1231 Encounter for screening mammogram for malignant neoplasm of breast: Secondary | ICD-10-CM | POA: Insufficient documentation

## 2021-01-11 DIAGNOSIS — M79673 Pain in unspecified foot: Secondary | ICD-10-CM

## 2021-01-11 MED ORDER — IBUPROFEN 800 MG PO TABS: 800.0000 mg | ORAL_TABLET | Freq: Three times a day (TID) | ORAL | 2 refills | Status: AC | PRN

## 2021-01-11 NOTE — Telephone Encounter (Signed)
I spent 5 total minutes of online digital evaluation and management services. 

## 2021-01-20 ENCOUNTER — Ambulatory Visit (INDEPENDENT_AMBULATORY_CARE_PROVIDER_SITE_OTHER): Payer: 59

## 2021-01-20 ENCOUNTER — Encounter: Payer: Self-pay | Admitting: Sports Medicine

## 2021-01-20 ENCOUNTER — Ambulatory Visit: Payer: 59 | Admitting: Sports Medicine

## 2021-01-20 ENCOUNTER — Other Ambulatory Visit: Payer: Self-pay

## 2021-01-20 DIAGNOSIS — M722 Plantar fascial fibromatosis: Secondary | ICD-10-CM | POA: Diagnosis not present

## 2021-01-20 NOTE — Assessment & Plan Note (Signed)
This is a pleasant 65 year old female, she has a 6-week history of pain in the plantar right heel, worse in the mornings. She has pes cavus on the right. On exam she has tenderness at the plantar fascia origin. We discussed avoiding barefoot walking, she will continue her three-quarter length custom orthotics, adding an air heel brace, continue NSAIDs. I would like some x-rays today, adding home rehab exercises. She has some over-the-counter cream which is probably fine, return to see me in 4 to 6 weeks, if not better we will do an ultrasound-guided plantar fascial origin injection. Sounds like she has appoint with Dr. Karle Barr coming up which I think is entirely appropriate as well.

## 2021-01-20 NOTE — Progress Notes (Signed)
    Procedures performed today:    None.  Independent interpretation of notes and tests performed by another provider:   None.  Brief History, Exam, Impression, and Recommendations:    Plantar fasciitis, right This is a pleasant 65 year old female, she has a 6-week history of pain in the plantar right heel, worse in the mornings. She has pes cavus on the right. On exam she has tenderness at the plantar fascia origin. We discussed avoiding barefoot walking, she will continue her three-quarter length custom orthotics, adding an air heel brace, continue NSAIDs. I would like some x-rays today, adding home rehab exercises. She has some over-the-counter cream which is probably fine, return to see me in 4 to 6 weeks, if not better we will do an ultrasound-guided plantar fascial origin injection. Sounds like she has appoint with Dr. Karle Barr coming up which I think is entirely appropriate as well.    ___________________________________________ Ihor Austin. Benjamin Stain, M.D., ABFM., CAQSM. Primary Care and Sports Medicine [place] [facility]  Adjunct Instructor of Family Medicine  [employer]

## 2021-01-21 ENCOUNTER — Ambulatory Visit (HOSPITAL_COMMUNITY): Payer: 59 | Admitting: Licensed Clinical Social Worker

## 2021-01-21 DIAGNOSIS — F419 Anxiety disorder, unspecified: Secondary | ICD-10-CM

## 2021-01-21 DIAGNOSIS — Z630 Problems in relationship with spouse or partner: Secondary | ICD-10-CM
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
## 2021-02-02 ENCOUNTER — Ambulatory Visit (HOSPITAL_COMMUNITY): Payer: 59 | Admitting: Licensed Clinical Social Worker

## 2021-02-02 ENCOUNTER — Other Ambulatory Visit: Payer: Self-pay

## 2021-02-02 DIAGNOSIS — Z630 Problems in relationship with spouse or partner: Secondary | ICD-10-CM

## 2021-02-02 DIAGNOSIS — F419 Anxiety disorder, unspecified: Secondary | ICD-10-CM
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
## 2021-02-17 ENCOUNTER — Ambulatory Visit: Payer: 59 | Admitting: Sports Medicine

## 2021-02-18 ENCOUNTER — Ambulatory Visit (HOSPITAL_COMMUNITY): Payer: 59 | Admitting: Licensed Clinical Social Worker

## 2021-03-02 ENCOUNTER — Ambulatory Visit (INDEPENDENT_AMBULATORY_CARE_PROVIDER_SITE_OTHER): Payer: 59 | Admitting: Licensed Clinical Social Worker

## 2021-03-02 DIAGNOSIS — F419 Anxiety disorder, unspecified: Secondary | ICD-10-CM

## 2021-03-02 DIAGNOSIS — Z630 Problems in relationship with spouse or partner: Secondary | ICD-10-CM | POA: Diagnosis not present

## 2021-03-03 NOTE — Progress Notes (Signed)
  THERAPIST PROGRESS NOTE  Session Time: 11:00 am-11:45 am  Type of Therapy: Individual Therapy  Purpose of session: "Sunny "[name]" will manage anxiety as evidenced by coping with stressors, managing anxious thoughts, cope with grief/spouses health issues and coping with marital dynamics for 5 out of 7 days for 60 days."  Interventions:  Therapist utilized CBT and Solution focused brief therapy with patient to address anxiety. Therapist provided support and empathy to patient during session. Therapist worked with patient to identify enjoyable moments, and what helped them to be peaceful. Therapist worked with patient to identify self talk to challenge anxious feelings.   Effectiveness: Patient was oriented x5 (person, place, situation, time, and object). Patient was neatly dressed, and appropriately groomed. Patient was alert, engaged, pleasant, and cooperative. Patient noted that she went to [place] to help her sister move and had a good time. She didn't feel criticized at all while there. She spoke to her pastor and filled him in on everything that is occurring at home and a phrase the pastor used stuck out to her "you are precious in the eyes of God" and she has been using this as a mantra with her husband and telling him "You are precious to me." It is a way to remind herself, and him that he is precious. Patient also shared a few experiences where she didn't feel rushed in her life such as going into [employer] and shopping for a blouse. She didn't feel rushed and could enjoy herself. She noted this was only 15 minutes but she could disconnect for a few minutes and not feel the pressures of work or home life. Patient is going to start reminding herself to "slow down" in addition to using her mantra "you are precious to me."   Patient was engaged in session. She responded well to interventions. Patient continues to meet criteria for Anxiety disorder, unspecified type. Patient will continue in  outpatient therapy due to being the least restrictive service to meet her needs at this time. Patient made minimal progress on her goals.   Suicidal/Homicidal: Negativewithout intent/plan   Plan: Return again in  2-4 weeks.  Diagnosis: Axis I: Anxiety disorder, unspecified type    Axis II: No diagnosis   Bynum Bellows, LCSW 03/03/2021

## 2021-03-04 ENCOUNTER — Ambulatory Visit (HOSPITAL_COMMUNITY): Payer: 59 | Admitting: Licensed Clinical Social Worker

## 2021-03-12 ENCOUNTER — Ambulatory Visit: Payer: 59 | Admitting: Sports Medicine

## 2021-03-15 ENCOUNTER — Ambulatory Visit (INDEPENDENT_AMBULATORY_CARE_PROVIDER_SITE_OTHER): Payer: 59 | Admitting: Licensed Clinical Social Worker

## 2021-03-15 DIAGNOSIS — F419 Anxiety disorder, unspecified: Secondary | ICD-10-CM | POA: Diagnosis not present

## 2021-03-15 DIAGNOSIS — Z630 Problems in relationship with spouse or partner: Secondary | ICD-10-CM | POA: Diagnosis not present
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
## 2021-03-29 ENCOUNTER — Ambulatory Visit (INDEPENDENT_AMBULATORY_CARE_PROVIDER_SITE_OTHER): Payer: 59 | Admitting: Licensed Clinical Social Worker

## 2021-03-29 DIAGNOSIS — F419 Anxiety disorder, unspecified: Secondary | ICD-10-CM | POA: Diagnosis not present

## 2021-03-29 DIAGNOSIS — Z630 Problems in relationship with spouse or partner: Secondary | ICD-10-CM

## 2021-03-30 NOTE — Progress Notes (Signed)
  THERAPIST PROGRESS NOTE  Session Time: 11:00 am-11:45 am  Type of Therapy: Individual Therapy  Purpose of session: "Connie Campos "[name]" will manage anxiety as evidenced by coping with stressors, managing anxious thoughts, cope with grief/spouses health issues and coping with marital dynamics for 5 out of 7 days for 60 days."  Interventions:  Therapist utilized CBT and Solution focused brief therapy with patient to address anxiety. Therapist provided support and empathy to patient during session. Therapist explored patient's work and relationship dynamics. Therapist highlighted patient's change in perception which has improved her mood and regulated anxiety.   Effectiveness: Patient was oriented x5 (person, place, situation, time, and object). Patient was casually dressed, and appropriately groomed. Patient was alert, engaged, pleasant, and cooperative. Patient appears anxious and/or high energy. Patient noted that she had her anniversary dinner with her husband. He was a perfect gentleman and the evening went well. Since then, he has been doing better with their interactions and not as rude. Patient is very busy at work and has been working late. She doesn't mind because she feels like she is "making traction" on some of the lingering problems and projects that are at work. Patient's perception of progress has changed. Just as at work, in her relationship she can see "traction" even under difficult circumstances and this has changed how she feels. She has been getting out more and socializing as well.   Patient was engaged in session. She responded well to interventions. Patient continues to meet criteria for Anxiety disorder, unspecified type. Patient will continue in outpatient therapy due to being the least restrictive service to meet her needs at this time. Patient made moderate progress on her goals.   Suicidal/Homicidal: Negativewithout intent/plan   Plan: Return again in  2-4  weeks.  Diagnosis: Axis I: Anxiety disorder, unspecified type    Axis II: No diagnosis   Bynum Bellows, LCSW 03/30/2021

## 2021-04-12 ENCOUNTER — Ambulatory Visit (INDEPENDENT_AMBULATORY_CARE_PROVIDER_SITE_OTHER): Payer: 59 | Admitting: Licensed Clinical Social Worker

## 2021-04-12 DIAGNOSIS — F419 Anxiety disorder, unspecified: Secondary | ICD-10-CM

## 2021-04-13 NOTE — Progress Notes (Signed)
  THERAPIST PROGRESS NOTE  Session Time: 2:00 pm-2:45 pm  Type of Therapy: Individual Therapy  Purpose of session: "Beryl "[name]" will manage anxiety as evidenced by coping with stressors, managing anxious thoughts, cope with grief/spouses health issues and coping with marital dynamics for 5 out of 7 days for 60 days."  Interventions:  Therapist utilized CBT and Solution focused brief therapy with patient to address anxiety. Therapist provided support and empathy to patient during session. Therapist explored patient's stressors and impact on sleep. Therapist worked with patient to identify ways to manage anxious thoughts and improve sleep.    Effectiveness: Patient was oriented x5 (person, place, situation, time, and object). Patient was casually dressed, and appropriately groomed. Patient was alert, engaged, pleasant, and cooperative. Patient has had difficulty sleeping at times, more often than not. Patient noted that she has a lot of stressors including work, and at home. There are a lot of changes occurring at work and she ponders about this a lot. She wakes up thinking about these stressors. Patient agreed to work on writing her thoughts/feelings out prior to bed and tracking her sleep.    Patient engaged in session. She responded well to interventions. Patient continues to meet criteria for Anxiety disorder, unspecified type. Patient will continue in outpatient therapy due to being the least restrictive service to meet her needs at this time. Patient made moderate progress on her goals.   Suicidal/Homicidal: Negativewithout intent/plan   Plan: Return again in  2-4 weeks.  Diagnosis: Axis I: Anxiety disorder, unspecified type    Axis II: No diagnosis   Bynum Bellows, LCSW 04/13/2021

## 2021-04-26 ENCOUNTER — Ambulatory Visit (INDEPENDENT_AMBULATORY_CARE_PROVIDER_SITE_OTHER): Payer: 59 | Admitting: Licensed Clinical Social Worker

## 2021-04-26 DIAGNOSIS — F419 Anxiety disorder, unspecified: Secondary | ICD-10-CM | POA: Diagnosis not present

## 2021-04-27 NOTE — Progress Notes (Signed)
  THERAPIST PROGRESS NOTE  Session Time: 3:00 pm-3:45 pm  Type of Therapy: Individual Therapy  Purpose of session: "Connie "[name]" will manage anxiety as evidenced by coping with stressors, managing anxious thoughts, cope with grief/spouses health issues and coping with marital dynamics for 5 out of 7 days for 60 days."  Interventions:  Therapist utilized CBT and Solution focused brief therapy with Connie Campos to address anxiety. Therapist provided support and empathy to Connie Campos during session. Therapist explored Connie Campos's triggers for anxiety and grief. Therapist worked with Connie Campos to identify steps to take to work on Connie Campos overall "wellness."   Effectiveness: Connie Campos was oriented x5 (person, place, situation, time, and object). Connie Campos was neatly dressed, and appropriately groomed. Connie Campos was alert, engaged, pleasant, and cooperative. Connie Campos is feeling overwhelmed at work and home. Connie Campos is experiencing anticipatory grief over Connie Campos husband who has lung cancer and had cancer on his brain as well. Connie Campos is also experiencing some anxiety and grief over the change to [employer] company. [employer] company announced that it had been sold. Connie Campos began to worry about Connie Campos job and the transition to the new companies rules, expectations, etc. Connie Campos understood that Connie Campos has to take care of Connie Campos overall wellness including Connie Campos physical health, spiritual health, relationships, and mental/emotional health. Connie Campos is taking steps to focus on Connie Campos wellness such as going to the gym, serving through [employer] church, spending time with friends, and being open about Connie Campos feelings.   Connie Campos engaged in session. Connie Campos responded well to interventions. Connie Campos continues to meet criteria for Anxiety disorder, unspecified type. Connie Campos will continue in outpatient therapy due to being the least restrictive service to meet Connie Campos needs at this time. Connie Campos made moderate progress on Connie Campos goals.   Suicidal/Homicidal: Negativewithout intent/plan   Plan:  Return again in  2-4 weeks.  Diagnosis: Axis I: Anxiety disorder, unspecified type    Axis II: No diagnosis   Bynum Bellows, LCSW 04/27/2021

## 2021-05-10 ENCOUNTER — Ambulatory Visit (INDEPENDENT_AMBULATORY_CARE_PROVIDER_SITE_OTHER): Payer: 59 | Admitting: Licensed Clinical Social Worker

## 2021-05-10 DIAGNOSIS — F419 Anxiety disorder, unspecified: Secondary | ICD-10-CM | POA: Diagnosis not present
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
## 2021-05-24 ENCOUNTER — Ambulatory Visit (INDEPENDENT_AMBULATORY_CARE_PROVIDER_SITE_OTHER): Payer: 59 | Admitting: Licensed Clinical Social Worker

## 2021-05-24 DIAGNOSIS — F419 Anxiety disorder, unspecified: Secondary | ICD-10-CM | POA: Diagnosis not present

## 2021-05-24 DIAGNOSIS — Z630 Problems in relationship with spouse or partner: Secondary | ICD-10-CM | POA: Diagnosis not present
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
## 2021-06-18 ENCOUNTER — Ambulatory Visit (INDEPENDENT_AMBULATORY_CARE_PROVIDER_SITE_OTHER): Payer: 59 | Admitting: Licensed Clinical Social Worker

## 2021-06-18 DIAGNOSIS — F419 Anxiety disorder, unspecified: Secondary | ICD-10-CM | POA: Diagnosis not present
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
## 2021-06-30 ENCOUNTER — Ambulatory Visit (INDEPENDENT_AMBULATORY_CARE_PROVIDER_SITE_OTHER): Payer: 59 | Admitting: Licensed Clinical Social Worker

## 2021-06-30 DIAGNOSIS — Z630 Problems in relationship with spouse or partner: Secondary | ICD-10-CM

## 2021-06-30 DIAGNOSIS — F419 Anxiety disorder, unspecified: Secondary | ICD-10-CM
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
## 2021-07-14 ENCOUNTER — Ambulatory Visit (INDEPENDENT_AMBULATORY_CARE_PROVIDER_SITE_OTHER): Payer: 59 | Admitting: Licensed Clinical Social Worker

## 2021-07-14 DIAGNOSIS — F419 Anxiety disorder, unspecified: Secondary | ICD-10-CM | POA: Diagnosis not present

## 2021-07-14 NOTE — Progress Notes (Signed)
°  THERAPIST PROGRESS NOTE  Session Time: 8:00 am-8:45 am  Type of Therapy: Individual Therapy  Purpose of session: "Connie "[name]" will manage anxiety as evidenced by coping with stressors, managing anxious thoughts, cope with grief/spouses health issues and coping with marital dynamics for 5 out of 7 days for 60 days."  Interventions:  Therapist utilized CBT and Solution focused brief therapy with patient to address anxiety. Therapist provided support and empathy to patient during session. Therapist explored patient's triggers for anxiety. Therapist worked with patient to identify ways she can stop living in a holding pattern and start living.   Effectiveness: Patient was oriented x5 (person, place, situation, time, and object). Patient was casually dressed, and appropriately groomed. Patient was alert, engaged, pleasant, and cooperative. Patient noted that her husband's brain cancer scans came back good. There had only been slight growth. Patient has decided to stop waiting for death (her husband's), and start living. Patient has felt like her life has been on hold and she is on pins and needles every few months waiting for the results of the scans. Her home is in disarray. She is going to focus on living again by working on some project around the home such as cleaning up some of her rooms.   Patient engaged in session. She responded well to interventions. Patient continues to meet criteria for Anxiety disorder, unspecified type. Patient will continue in outpatient therapy due to being the least restrictive service to meet her needs at this time. Patient made moderate progress on her goals.   Suicidal/Homicidal: Negativewithout intent/plan   Plan: Return again in  2-4 weeks.  Diagnosis: Axis I: Anxiety disorder, unspecified type    Axis II: No diagnosis   Bynum Bellows, LCSW 07/14/2021

## 2021-07-28 ENCOUNTER — Ambulatory Visit (HOSPITAL_COMMUNITY): Payer: 59 | Admitting: Licensed Clinical Social Worker

## 2021-08-12 ENCOUNTER — Ambulatory Visit (INDEPENDENT_AMBULATORY_CARE_PROVIDER_SITE_OTHER): Payer: 59 | Admitting: Licensed Clinical Social Worker

## 2021-08-12 DIAGNOSIS — Z630 Problems in relationship with spouse or partner: Secondary | ICD-10-CM

## 2021-08-12 DIAGNOSIS — F419 Anxiety disorder, unspecified: Secondary | ICD-10-CM
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
## 2021-08-25 ENCOUNTER — Ambulatory Visit (HOSPITAL_COMMUNITY): Payer: 59 | Admitting: Licensed Clinical Social Worker

## 2021-09-07 ENCOUNTER — Ambulatory Visit (INDEPENDENT_AMBULATORY_CARE_PROVIDER_SITE_OTHER): Payer: 59 | Admitting: Licensed Clinical Social Worker

## 2021-09-07 DIAGNOSIS — F419 Anxiety disorder, unspecified: Secondary | ICD-10-CM | POA: Diagnosis not present

## 2021-09-07 DIAGNOSIS — Z630 Problems in relationship with spouse or partner: Secondary | ICD-10-CM

## 2021-09-07 NOTE — Progress Notes (Signed)
? ?  THERAPIST PROGRESS NOTE ? ?Session Time: 8:00 am-8:45 am ? ?Type of Therapy: Individual Therapy ? ?Purpose of session: Connie "[name]" will manage anxiety as evidenced by coping with stressors, managing anxious thoughts, cope with grief/spouses health issues and coping with marital dynamics for 5 out of 7 days for 60 days. ? ?ProgressTowards Goals: Progressing ? ?Interventions: Therapist utilized CBT and Solution focused brief therapy with patient to address anxiety. Therapist provided support and empathy to patient during session. Therapist worked with patient to identify what she has done well and steps she has taken to reduce anxiety.  ? ?Effectiveness: Patient was oriented x4 (person, place, situation, and time). Patient was casually dressed, and appropriately groomed. Patient was alert, engaged, pleasant, and cooperative. Patient feels like she has been a failure for lent. She has not done what she set out to do. After discussion, patient was able to identify that she has signed up for a yoga class with her friend she hasn't seen in a few years, planning on having a girls night at a gala, paying off her mortgage early, and drinking less. Patient realizes these help her feel better and feels like they are an investment in herself.   ? ?Patient engaged in session. She responded well to interventions. Patient continues to meet criteria for Anxiety disorder, unspecified type. Patient will continue in outpatient therapy due to being the least restrictive service to meet her needs at this time. Patient made moderate progress on her goals.  ? ? ?Suicidal/Homicidal: Nowithout intent/plan ? ?Plan: Return again in 4-6 weeks. Patient will continue to focus on making investment in herself.  ? ?Diagnosis: Anxiety disorder, unspecified type ? ?Marital stress ? ?Collaboration of Care: Other collaboration sources will be identified to collaborate with as needed per patient.  ? ?Patient/Guardian was advised Release of  Information must be obtained prior to any record release in order to collaborate their care with an outside provider. Patient/Guardian was advised if they have not already done so to contact the registration department to sign all necessary forms in order for [place] to release information regarding their care.  ? ?Consent: Patient/Guardian gives verbal consent for treatment and assignment of benefits for services provided during this visit. Patient/Guardian expressed understanding and agreed to proceed.  ? ?Bynum Bellows, LCSW ?09/07/2021 ? ?

## 2021-10-06 ENCOUNTER — Ambulatory Visit (INDEPENDENT_AMBULATORY_CARE_PROVIDER_SITE_OTHER): Payer: 59 | Admitting: Licensed Clinical Social Worker

## 2021-10-06 DIAGNOSIS — Z630 Problems in relationship with spouse or partner: Secondary | ICD-10-CM

## 2021-10-06 DIAGNOSIS — F419 Anxiety disorder, unspecified: Secondary | ICD-10-CM

## 2021-10-07 NOTE — Progress Notes (Signed)
? ?  THERAPIST PROGRESS NOTE ? ?Session Time: 1:00 pm-1:45 pm ? ?Type of Therapy: Individual Therapy ? ?Purpose of session: [name] will manage anxiety as evidenced by coping with stressors, managing anxious thoughts, cope with grief/spouses health issues and coping with marital dynamics for 5 out of 7 days for 60 days. ? ?ProgressTowards Goals: Progressing ? ?Interventions: Therapist utilized CBT and Solution focused brief therapy with patient to address anxiety. Therapist provided support and empathy to patient during session. Therapist explored patient's triggers for anxiety and how she could start "living" again.  ? ?Effectiveness: Patient was oriented x4 (person, place, situation, and time). Patient was alert, engaged, pleasant, and cooperative. Patient was casually dressed, and appropriately groomed. Patient noted that some good things and difficult things happened. Patient had a birthday recently. She got a present from her sister in laws but what she wanted was social interaction with them. Her gift was left at the door. Patient feels like her coworkers were whispering about something at work and she wasn't included. She didn't feel like they were talking about her but felt excluded. Patient's husband got good news from his scans. The cancer on his brain was dying or gone, and the tumor in his lung is growing very slowly. She feels like they need to start living again. They were both waiting on his death. Patient cleaned up a little and they had family over. It felt very nice for them both and they both want to do it again.  ? ?Patient engaged in session. She responded well to interventions. Patient continues to meet criteria for Anxiety disorder, unspecified type. Patient will continue in outpatient therapy due to being the least restrictive service to meet her needs at this time. Patient made moderate progress on her goals.  ? ? ?Suicidal/Homicidal: Nowithout intent/plan ? ?Plan: Return again in  4-6 weeks. Patient will take steps to start "living again" such as cleaning up and having people over.  ? ?Diagnosis: Anxiety disorder, unspecified type ? ?Marital stress ? ?Collaboration of Care: Other collaboration sources will be identified to collaborate with as needed per patient.  ? ?Patient/Guardian was advised Release of Information must be obtained prior to any record release in order to collaborate their care with an outside provider. Patient/Guardian was advised if they have not already done so to contact the registration department to sign all necessary forms in order for [place] to release information regarding their care.  ? ?Consent: Patient/Guardian gives verbal consent for treatment and assignment of benefits for services provided during this visit. Patient/Guardian expressed understanding and agreed to proceed.  ? ?Bynum Bellows, LCSW ?10/07/2021 ? ?

## 2021-11-01 ENCOUNTER — Ambulatory Visit (INDEPENDENT_AMBULATORY_CARE_PROVIDER_SITE_OTHER): Payer: 59 | Admitting: Licensed Clinical Social Worker

## 2021-11-01 DIAGNOSIS — F419 Anxiety disorder, unspecified: Secondary | ICD-10-CM | POA: Diagnosis not present

## 2021-11-02 NOTE — Progress Notes (Signed)
   THERAPIST PROGRESS NOTE  Session Time: 1:00 pm-1:45 pm  Type of Therapy: Individual Therapy  Purpose of session: Raylee "[name]" will manage anxiety as evidenced by coping with stressors, managing anxious thoughts, cope with grief/spouses health issues and coping with marital dynamics for 5 out of 7 days for 60 days.  ProgressTowards Goals: Progressing  Interventions: Therapist utilized CBT and Solution focused brief therapy with patient to address anxiety. Therapist provided support and empathy to patient during session. Therapist explored patient's triggers for anxiety. Therapist worked with patient to identify steps she can take at work to manage some of her anxiety.   Effectiveness: Patient was oriented x4 (person, place, situation, and time). Patient was alert, engaged, anxious, and cooperative. Patient was alert, engaged, pleasant, and cooperative. Patient is worried about her job. She feels like her supervisors exclude her at work. Patient worries that her supervisor has a negative view of her and doesn't trust her. She had a situation where her supervisor asked her to preparing information for a meeting but didn't specify which meeting so patient assumed it was the next meeting the following day. Patient was panicked and worked late to get the information. Her supervisor got upset with her over this. Patient is going to talk to her supervisor about how to succeed in her job to allow the supervisors motherly instincts and coaching desire to kick in. Patient feels like this would reduce some of her anxiety.   Patient engaged in session. She responded well to interventions. Patient continues to meet criteria for Anxiety disorder, unspecified type. Patient will continue in outpatient therapy due to being the least restrictive service to meet her needs at this time. Patient made moderate progress on her goals.    Suicidal/Homicidal: Nowithout intent/plan  Plan: Return again in 4-6 weeks.  Patient focus on the present and control what she can control with her job including asking her supervisor what she can do to succeed/advance in her position.   Diagnosis: Anxiety disorder, unspecified type  Collaboration of Care: Other collaboration sources will be identified to collaborate with as needed per patient.   Patient/Guardian was advised Release of Information must be obtained prior to any record release in order to collaborate their care with an outside provider. Patient/Guardian was advised if they have not already done so to contact the registration department to sign all necessary forms in order for [place] to release information regarding their care.   Consent: Patient/Guardian gives verbal consent for treatment and assignment of benefits for services provided during this visit. Patient/Guardian expressed understanding and agreed to proceed.   Bynum Bellows, LCSW 11/02/2021

## 2021-11-29 ENCOUNTER — Ambulatory Visit (INDEPENDENT_AMBULATORY_CARE_PROVIDER_SITE_OTHER): Payer: 59 | Admitting: Licensed Clinical Social Worker

## 2021-11-29 DIAGNOSIS — F419 Anxiety disorder, unspecified: Secondary | ICD-10-CM

## 2021-11-29 DIAGNOSIS — Z630 Problems in relationship with spouse or partner: Secondary | ICD-10-CM
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
## 2021-11-30 NOTE — Progress Notes (Deleted)
   THERAPIST PROGRESS NOTE  Session Time: 1:00 pm-1:45 pm  Type of Therapy: Individual Therapy  Purpose of session: Connie "[name]" will manage anxiety as evidenced by coping with stressors, managing anxious thoughts, cope with grief/spouses health issues and coping with marital dynamics for 5 out of 7 days for 60 days.  ProgressTowards Goals: Progressing  Interventions: Therapist utilized CBT and Solution focused brief therapy with patient to address anxiety. Therapist provided support and empathy to patient during session. Therapist explored patient's triggers for anxiety. Therapist worked with patient to identify steps she can take at work to manage some of her anxiety.   Effectiveness: Patient was oriented x4 (person, place, situation, and time). Patient was alert, engaged, anxious, and cooperative. Patient was alert, engaged, pleasant, and cooperative. Patient is worried about her job. She feels like her supervisors exclude her at work. Patient worries that her supervisor has a negative view of her and doesn't trust her. She had a situation where her supervisor asked her to preparing information for a meeting but didn't specify which meeting so patient assumed it was the next meeting the following day. Patient was panicked and worked late to get the information. Her supervisor got upset with her over this. Patient is going to talk to her supervisor about how to succeed in her job to allow the supervisors motherly instincts and coaching desire to kick in. Patient feels like this would reduce some of her anxiety.   Patient engaged in session. She responded well to interventions. Patient continues to meet criteria for Anxiety disorder, unspecified type. Patient will continue in outpatient therapy due to being the least restrictive service to meet her needs at this time. Patient made moderate progress on her goals.    Suicidal/Homicidal: Nowithout intent/plan  Plan: Return again in 4-6 weeks.  Patient focus on the present and control what she can control with her job including asking her supervisor what she can do to succeed/advance in her position.   Diagnosis: No diagnosis found.  Collaboration of Care: Other collaboration sources will be identified to collaborate with as needed per patient.   Patient/Guardian was advised Release of Information must be obtained prior to any record release in order to collaborate their care with an outside provider. Patient/Guardian was advised if they have not already done so to contact the registration department to sign all necessary forms in order for [place] to release information regarding their care.   Consent: Patient/Guardian gives verbal consent for treatment and assignment of benefits for services provided during this visit. Patient/Guardian expressed understanding and agreed to proceed.   Bynum Bellows, LCSW 11/30/2021

## 2021-12-01 ENCOUNTER — Other Ambulatory Visit (HOSPITAL_BASED_OUTPATIENT_CLINIC_OR_DEPARTMENT_OTHER): Payer: Self-pay | Admitting: Obstetrics and Gynecology

## 2021-12-01 DIAGNOSIS — Z1231 Encounter for screening mammogram for malignant neoplasm of breast: Secondary | ICD-10-CM

## 2022-01-24 ENCOUNTER — Ambulatory Visit (HOSPITAL_COMMUNITY): Payer: 59 | Admitting: Licensed Clinical Social Worker

## 2022-01-26 ENCOUNTER — Ambulatory Visit (HOSPITAL_BASED_OUTPATIENT_CLINIC_OR_DEPARTMENT_OTHER): Payer: 59

## 2022-02-02 ENCOUNTER — Ambulatory Visit (HOSPITAL_BASED_OUTPATIENT_CLINIC_OR_DEPARTMENT_OTHER): Payer: 59

## 2022-02-08 ENCOUNTER — Ambulatory Visit (HOSPITAL_BASED_OUTPATIENT_CLINIC_OR_DEPARTMENT_OTHER)
Admission: RE | Admit: 2022-02-08 | Discharge: 2022-02-08 | Disposition: A | Discharge status: Home / Self Care | Payer: BC Managed Care – PPO | Source: Ambulatory Visit | Attending: Obstetrics and Gynecology | Admitting: Obstetrics and Gynecology

## 2022-02-08 ENCOUNTER — Encounter (HOSPITAL_BASED_OUTPATIENT_CLINIC_OR_DEPARTMENT_OTHER): Payer: Self-pay

## 2022-02-08 DIAGNOSIS — Z1231 Encounter for screening mammogram for malignant neoplasm of breast: Secondary | ICD-10-CM | POA: Insufficient documentation

## 2022-02-09 ENCOUNTER — Inpatient Hospital Stay (HOSPITAL_BASED_OUTPATIENT_CLINIC_OR_DEPARTMENT_OTHER): Admission: RE | Admit: 2022-02-09 | Payer: 59 | Source: Ambulatory Visit

## 2022-02-21 ENCOUNTER — Ambulatory Visit (HOSPITAL_COMMUNITY): Payer: 59 | Admitting: Licensed Clinical Social Worker

## 2022-06-21 ENCOUNTER — Encounter: Payer: Self-pay | Admitting: Family Medicine

## 2022-06-21 ENCOUNTER — Ambulatory Visit
Admission: EM | Admit: 2022-06-21 | Discharge: 2022-06-21 | Disposition: A | Discharge status: Home / Self Care | Payer: BC Managed Care – PPO

## 2022-06-21 DIAGNOSIS — J0100 Acute maxillary sinusitis, unspecified: Secondary | ICD-10-CM

## 2022-06-21 DIAGNOSIS — R059 Cough, unspecified: Secondary | ICD-10-CM

## 2022-06-21 MED ORDER — BENZONATATE 200 MG PO CAPS: 200.0000 mg | ORAL_CAPSULE | Freq: Three times a day (TID) | ORAL | 0 refills | Status: AC | PRN

## 2022-06-21 MED ORDER — PREDNISONE 20 MG PO TABS: ORAL_TABLET | ORAL | 0 refills | Status: AC

## 2022-06-21 MED ORDER — AMOXICILLIN 875 MG PO TABS: 875.0000 mg | ORAL_TABLET | Freq: Two times a day (BID) | ORAL | 0 refills | Status: AC

## 2022-06-21 NOTE — Discharge Instructions (Addendum)
Instructed patient to take medication as directed with food to completion.  Advised patient to take prednisone with first dose of amoxicillin for the next 5 to 7 days.  Advised may take Tessalon Perles daily or as needed for cough.  Encourage patient increase daily water intake to 64 ounces per day while taking these medications.  Advised if symptoms worsen and/or unresolved please follow-up with PCP or here for further evaluation.

## 2022-06-21 NOTE — ED Provider Notes (Signed)
[facility]    CSN: [id number] Arrival date & time: 06/21/22  0827      History   Chief Complaint Chief Complaint  Patient presents with   Cough    HPI Connie Campos is a 67 y.o. female.   HPI 67 year old female presents with cough for 4-5 days, mild body aches, sinus congestion and drainage.  Reports taking OTC Tylenol 500 mg at 5 AM this morning.  Past Medical History:  Diagnosis Date   COVID 06/2019   Herniated disc    History of herniated intervertebral disc     Patient Active Problem List   Diagnosis Date Noted   Plantar fasciitis, right 01/20/2021   Chronic pain of left knee 04/29/2020    Past Surgical History:  Procedure Laterality Date   BREAST BIOPSY Right    FOOT SURGERY      OB History   No obstetric history on file.      Home Medications    Prior to Admission medications   Medication Sig Start Date End Date Taking? Authorizing Provider  ALPRAZolam (XANAX) 0.5 MG tablet TAKE 1 TABLET BY MOUTH ONCE DAILY NIGHTLY AS NEEDED FOR ANXIETY 11/29/21  Yes [provider]  amoxicillin (AMOXIL) 875 MG tablet Take 1 tablet (875 mg total) by mouth 2 (two) times daily for 7 days. 06/21/22 06/28/22 Yes Trevor Iha, FNP  benzonatate (TESSALON) 200 MG capsule Take 1 capsule (200 mg total) by mouth 3 (three) times daily as needed for up to 7 days. 06/21/22 06/28/22 Yes Trevor Iha, FNP  estradiol (ESTRACE) 0.1 MG/GM vaginal cream USE AS DIRECTED- ONE GRAM A WEEK VAGINALLY 02/08/22  Yes [provider]  fluticasone (FLONASE) 50 MCG/ACT nasal spray Place 2 sprays into both nostrils daily.   Yes [provider]  predniSONE (DELTASONE) 20 MG tablet Take 3 tabs PO daily x 5 days. 06/21/22  Yes Trevor Iha, FNP  triamcinolone cream (KENALOG) 0.1 % APPLY 2 TIMES A DAY TO ITCHY AREAS ON THE BODY. NEVER TO THE FACE. 05/17/22  Yes [provider]  ibuprofen (ADVIL) 800 MG tablet Take 1 tablet (800 mg total) by mouth every 8  (eight) hours as needed. Patient not taking: Reported on 06/21/2022 01/11/21   Monica Becton, MD  omeprazole (PRILOSEC) 40 MG capsule Take 40 mg by mouth daily.    [provider]  Vitamin E (VITAMIN E/D-ALPHA NATURAL) 268 MG (400 UNIT) CAPS Take by mouth.    [provider]    Family History Family History  Problem Relation Age of Onset   Diabetes Mother    Cancer Father        lung   Diabetes Sister    Cancer Sister 32       Breast   Diabetes Brother     Social History Social History   Tobacco Use   Smoking status: Former    Packs/day: 0.10    Years: 10.00    Total pack years: 1.00    Types: Cigarettes   Smokeless tobacco: Never  Vaping Use   Vaping Use: Never used  Substance Use Topics   Alcohol use: Yes    Alcohol/week: 4.0 standard drinks of alcohol    Types: 4 Glasses of wine per week    Comment: weekly   Drug use: No     Allergies   Miconazole nitrate   Review of Systems Review of Systems  HENT:  Positive for congestion and sinus pressure.   Respiratory:  Positive for  cough.   Musculoskeletal:  Positive for myalgias.  All other systems reviewed and are negative.    Physical Exam Triage Vital Signs ED Triage Vitals  Enc Vitals Group     BP 06/21/22 0844 117/81     Pulse Rate 06/21/22 0844 99     Resp 06/21/22 0844 16     Temp 06/21/22 0844 99.4 F (37.4 C)     Temp Source 06/21/22 0844 Oral     SpO2 06/21/22 0844 97 %     Weight 06/21/22 0846 177 lb (80.3 kg)     Height 06/21/22 0846 5\' 6"  (1.676 m)     Head Circumference --      Peak Flow --      Pain Score 06/21/22 0845 4     Pain Loc --      Pain Edu? --      Excl. in GC? --    No data found.  Updated Vital Signs BP 117/81 (BP Location: Left Arm)   Pulse 99   Temp 99.4 F (37.4 C) (Oral)   Resp 16   Ht 5\' 6"  (1.676 m)   Wt 177 lb (80.3 kg)   SpO2 97%   BMI 28.57 kg/m   Visual Acuity Right Eye Distance:   Left Eye Distance:   Bilateral Distance:     Right Eye Near:   Left Eye Near:    Bilateral Near:     Physical Exam Vitals and nursing note reviewed.  Constitutional:      General: She is not in acute distress.    Appearance: Normal appearance. She is obese. She is ill-appearing.  HENT:     Head: Normocephalic and atraumatic.     Right Ear: Tympanic membrane and external ear normal.     Left Ear: Tympanic membrane and external ear normal.     Ears:     Comments: Significant eustachian tube dysfunction noted bilaterally    Nose:     Right Sinus: Maxillary sinus tenderness present.     Left Sinus: Maxillary sinus tenderness present.     Comments: Turbinates are erythematous/edematous, with trace clear rhinorrhea    Mouth/Throat:     Mouth: Mucous membranes are moist.     Pharynx: Oropharynx is clear.  Eyes:     Extraocular Movements: Extraocular movements intact.     Conjunctiva/sclera: Conjunctivae normal.     Pupils: Pupils are equal, round, and reactive to light.  Cardiovascular:     Rate and Rhythm: Normal rate.     Pulses: Normal pulses.     Heart sounds: Normal heart sounds. No murmur heard. Pulmonary:     Effort: Pulmonary effort is normal.     Breath sounds: Normal breath sounds. No wheezing, rhonchi or rales.     Comments: Infrequent nonproductive cough noted on exam Musculoskeletal:        General: Normal range of motion.     Cervical back: Normal range of motion and neck supple.  Skin:    General: Skin is warm and dry.  Neurological:     General: No focal deficit present.     Mental Status: She is alert and oriented to person, place, and time.      UC Treatments / Results  Labs (all labs ordered are listed, but only abnormal results are displayed) Labs Reviewed - No data to display  EKG   Radiology No results found.  Procedures Procedures (including critical care time)  Medications Ordered in UC Medications - No  data to display  Initial Impression / Assessment and Plan / UC Course  I  have reviewed the triage vital signs and the nursing notes.  Pertinent labs & imaging results that were available during my care of the patient were reviewed by me and considered in my medical decision making (see chart for details).     MDM: 1.  Acute maxillary sinusitis, recurrence not specified-Rx'd amoxicillin; 2.  Cough Rx'd prednisone, Tessalon Perles instructed patient to take medication as directed with food to completion.  Advised patient to take prednisone with first dose of amoxicillin for the next 5 to 7 days.  Advised may take Tessalon Perles daily or as needed for cough.  Encourage patient increase daily water intake to 64 ounces per day while taking these medications.  Advised if symptoms worsen and/or unresolved please follow-up with PCP or here for further evaluation. Final Clinical Impressions(s) / UC Diagnoses   Final diagnoses:  Acute maxillary sinusitis, recurrence not specified  Cough, unspecified type     Discharge Instructions      Instructed patient to take medication as directed with food to completion.  Advised patient to take prednisone with first dose of amoxicillin for the next 5 to 7 days.  Advised may take Tessalon Perles daily or as needed for cough.  Encourage patient increase daily water intake to 64 ounces per day while taking these medications.  Advised if symptoms worsen and/or unresolved please follow-up with PCP or here for further evaluation.     ED Prescriptions     Medication Sig Dispense Auth. Provider   amoxicillin (AMOXIL) 875 MG tablet Take 1 tablet (875 mg total) by mouth 2 (two) times daily for 7 days. 14 tablet Eliezer Lofts, FNP   predniSONE (DELTASONE) 20 MG tablet Take 3 tabs PO daily x 5 days. 15 tablet Eliezer Lofts, FNP   benzonatate (TESSALON) 200 MG capsule Take 1 capsule (200 mg total) by mouth 3 (three) times daily as needed for up to 7 days. 40 capsule Eliezer Lofts, FNP      PDMP not reviewed this encounter.   Eliezer Lofts, [place] 06/21/22 [phone]
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
## 2023-04-17 ENCOUNTER — Other Ambulatory Visit (HOSPITAL_BASED_OUTPATIENT_CLINIC_OR_DEPARTMENT_OTHER): Payer: Self-pay | Admitting: Obstetrics and Gynecology

## 2023-04-17 ENCOUNTER — Other Ambulatory Visit: Payer: Self-pay | Admitting: Obstetrics and Gynecology

## 2023-04-17 DIAGNOSIS — Z1231 Encounter for screening mammogram for malignant neoplasm of breast: Secondary | ICD-10-CM

## 2023-04-27 ENCOUNTER — Ambulatory Visit (HOSPITAL_BASED_OUTPATIENT_CLINIC_OR_DEPARTMENT_OTHER)
Admission: RE | Admit: 2023-04-27 | Discharge: 2023-04-27 | Disposition: A | Discharge status: Home / Self Care | Payer: Medicare Other | Source: Ambulatory Visit | Attending: Obstetrics and Gynecology | Admitting: Obstetrics and Gynecology

## 2023-04-27 ENCOUNTER — Encounter (HOSPITAL_BASED_OUTPATIENT_CLINIC_OR_DEPARTMENT_OTHER): Payer: Self-pay

## 2023-04-27 DIAGNOSIS — Z1231 Encounter for screening mammogram for malignant neoplasm of breast: Secondary | ICD-10-CM | POA: Insufficient documentation

## 2024-02-13 ENCOUNTER — Encounter: Payer: Self-pay | Admitting: Sports Medicine

## 2024-05-27 ENCOUNTER — Other Ambulatory Visit (HOSPITAL_BASED_OUTPATIENT_CLINIC_OR_DEPARTMENT_OTHER): Payer: Self-pay | Admitting: Nurse Practitioner

## 2024-05-27 DIAGNOSIS — Z1231 Encounter for screening mammogram for malignant neoplasm of breast: Secondary | ICD-10-CM

## 2024-06-01 ENCOUNTER — Ambulatory Visit (HOSPITAL_BASED_OUTPATIENT_CLINIC_OR_DEPARTMENT_OTHER)
Admission: RE | Admit: 2024-06-01 | Discharge: 2024-06-01 | Disposition: A | Discharge status: Home / Self Care | Source: Ambulatory Visit | Attending: Nurse Practitioner | Admitting: Nurse Practitioner

## 2024-06-01 DIAGNOSIS — Z1231 Encounter for screening mammogram for malignant neoplasm of breast: Secondary | ICD-10-CM | POA: Insufficient documentation
